# Patient Record
Sex: Female | Born: 1995 | Race: Black or African American | Hispanic: No | Marital: Single | Smoking: Former smoker
Health system: Southern US, Community
[De-identification: ages and names within clinical notes are randomized; demographics above are authoritative.]

## PROBLEM LIST (undated history)

## (undated) ENCOUNTER — Inpatient Hospital Stay (HOSPITAL_COMMUNITY): Payer: Self-pay

## (undated) DIAGNOSIS — O039 Complete or unspecified spontaneous abortion without complication: Secondary | ICD-10-CM

## (undated) DIAGNOSIS — E049 Nontoxic goiter, unspecified: Secondary | ICD-10-CM

## (undated) DIAGNOSIS — O09299 Supervision of pregnancy with other poor reproductive or obstetric history, unspecified trimester: Secondary | ICD-10-CM

## (undated) DIAGNOSIS — IMO0001 Reserved for inherently not codable concepts without codable children: Secondary | ICD-10-CM

## (undated) HISTORY — DX: Supervision of pregnancy with other poor reproductive or obstetric history, unspecified trimester: O09.299

---

## 2014-08-27 ENCOUNTER — Emergency Department (HOSPITAL_COMMUNITY)
Admission: EM | Admit: 2014-08-27 | Discharge: 2014-08-27 | Disposition: A | Discharge status: Home / Self Care | Payer: 59 | Attending: Physician Assistant | Admitting: Physician Assistant

## 2014-08-27 ENCOUNTER — Encounter (HOSPITAL_COMMUNITY): Payer: Self-pay | Admitting: *Deleted

## 2014-08-27 ENCOUNTER — Emergency Department (HOSPITAL_COMMUNITY): Payer: 59

## 2014-08-27 DIAGNOSIS — R0602 Shortness of breath: Secondary | ICD-10-CM | POA: Insufficient documentation

## 2014-08-27 DIAGNOSIS — R0789 Other chest pain: Secondary | ICD-10-CM | POA: Diagnosis present

## 2014-08-27 DIAGNOSIS — Z880 Allergy status to penicillin: Secondary | ICD-10-CM | POA: Diagnosis not present

## 2014-08-27 DIAGNOSIS — Z87891 Personal history of nicotine dependence: Secondary | ICD-10-CM | POA: Diagnosis not present

## 2014-08-27 LAB — BASIC METABOLIC PANEL
Anion gap: 8 (ref 5–15)
BUN: 6 mg/dL (ref 6–20)
CO2: 25 mmol/L (ref 22–32)
Calcium: 9.2 mg/dL (ref 8.9–10.3)
Chloride: 101 mmol/L (ref 101–111)
Creatinine, Ser: 0.67 mg/dL (ref 0.44–1.00)
GFR calc Af Amer: >60 mL/min (ref >60)
GFR calc non Af Amer: >60 mL/min (ref >60)
GLUCOSE: 87 mg/dL (ref 65–99)
POTASSIUM: 3.4 mmol/L — AB (ref 3.5–5.1)
Sodium: 134 mmol/L — ABNORMAL LOW (ref 135–145)

## 2014-08-27 LAB — CBC
HEMATOCRIT: 36 % (ref 36.0–46.0)
HEMOGLOBIN: 11.9 g/dL — AB (ref 12.0–15.0)
MCH: 26.2 pg (ref 26.0–34.0)
MCHC: 33.1 g/dL (ref 30.0–36.0)
MCV: 79.3 fL (ref 78.0–100.0)
Platelets: 230 10*3/uL (ref 150–400)
RBC: 4.54 MIL/uL (ref 3.87–5.11)
RDW: 14.3 % (ref 11.5–15.5)
WBC: 8.1 10*3/uL (ref 4.0–10.5)

## 2014-08-27 LAB — POC URINE PREG, ED: PREG TEST UR: NEGATIVE

## 2014-08-27 LAB — I-STAT TROPONIN, ED: Troponin i, poc: 0 ng/mL (ref 0.00–0.08)

## 2014-08-27 LAB — D-DIMER, QUANTITATIVE (NOT AT ARMC): D-Dimer, Quant: 0.8 ug/mL-FEU — ABNORMAL HIGH (ref 0.00–0.48)

## 2014-08-27 MED ORDER — GI COCKTAIL ~~LOC~~
30.0000 mL | Freq: Once | ORAL | Status: AC
  Administered 2014-08-27: 30 mL via ORAL
  Filled 2014-08-27: qty 30

## 2014-08-27 MED ORDER — IOHEXOL 350 MG/ML SOLN
75.0000 mL | Freq: Once | INTRAVENOUS | Status: AC | PRN
Start: 2014-08-27 — End: 2014-08-27
  Administered 2014-08-27: 75 mL via INTRAVENOUS

## 2014-08-27 MED ORDER — IBUPROFEN 800 MG PO TABS
800.0000 mg | ORAL_TABLET | Freq: Once | ORAL | Status: AC
  Administered 2014-08-27: 800 mg via ORAL
  Filled 2014-08-27: qty 1

## 2014-08-27 NOTE — ED Notes (Signed)
Patient reports centered chest pain when she takes a deep breath in. Rates pain 2/10.

## 2014-08-27 NOTE — ED Notes (Signed)
EDP at bedside  

## 2014-08-27 NOTE — ED Provider Notes (Signed)
CSN: [id number]     Arrival date & time 08/27/14  1251 History   First MD Initiated Contact with Patient 08/27/14 1332     Chief Complaint  Patient presents with  . Chest Pain     (Consider location/radiation/quality/duration/timing/severity/associated sxs/prior Treatment) HPI  Samantha Blair is a 19yo F with no PMH who presents with intermittent sharp substernal chest pain that began last night. She was sleeping and was woken up in the middle of the night by the substernal chest pain, which she describes as a sharp pain along with squeezing, occasionally radiating straight through to her back. She says that she also had some shortness of breath and palpitations at that time, but no other symptoms. She was awoken by another episode during the night and then had these symptoms throughout this morning. She denies any recent stressors, travel, sick contacts, substance use. Her pain is not associated with any meals. She is currently on her menstrual cycle. She does not take OCPs. She has no family history of clotting disorders or heart disease.  History reviewed. No pertinent past medical history. Past Surgical History  Procedure Laterality Date  . Cesarean section     No family history on file. History  Substance Use Topics  . Smoking status: Former Games developer  . Smokeless tobacco: Not on file  . Alcohol Use: No   OB History    No data available     Review of Systems  Constitutional: Negative for fever, chills and diaphoresis.  HENT: Negative for ear pain, sinus pressure and sore throat.   Eyes: Negative for visual disturbance.  Respiratory: Positive for shortness of breath. Negative for cough and wheezing.   Cardiovascular: Positive for chest pain and palpitations. Negative for leg swelling.  Gastrointestinal: Negative for nausea, vomiting, abdominal pain, diarrhea, constipation and blood in stool.  Endocrine: Negative for polyuria.  Genitourinary: Negative for dysuria, urgency, frequency,  flank pain, decreased urine volume and difficulty urinating.  Musculoskeletal: Negative for back pain and gait problem.  Skin: Negative for rash.  Neurological: Negative for dizziness, syncope, weakness, numbness and headaches.  Hematological: Does not bruise/bleed easily.  Psychiatric/Behavioral: Negative for confusion and agitation.  All other systems reviewed and are negative.     Allergies  Penicillins  Home Medications   Prior to Admission medications   Not on File   BP 123/83 mmHg  Pulse 96  Temp(Src) 100.6 F (38.1 C) (Oral)  Resp 17  Ht  (1.651 m)  Wt 145 lb (65.772 kg)  BMI 24.13 kg/m2  SpO2 100%  LMP 08/24/2014 Physical Exam  Constitutional: She is oriented to person, place, and time. She appears well-nourished. No distress.  HENT:  Head: Normocephalic and atraumatic.  Right Ear: External ear normal.  Left Ear: External ear normal.  Eyes: Conjunctivae are normal. Pupils are equal, round, and reactive to light.  Neck: Normal range of motion. Neck supple. No tracheal deviation present. No thyromegaly present.  Thyroid non-tender, non-enlarged, no palpable nodules  Cardiovascular: Normal heart sounds and intact distal pulses.  Exam reveals no gallop and no friction rub.   No murmur heard. Tachycardic rate, regular rhythm  Pulmonary/Chest: Effort normal and breath sounds normal. She has no wheezes. She has no rales.  Abdominal: Soft. Bowel sounds are normal. She exhibits no distension. There is no tenderness.  Musculoskeletal: Normal range of motion. She exhibits no edema or tenderness.  Neurological: She is alert and oriented to person, place, and time. She has normal reflexes. No cranial  nerve deficit. Coordination normal.  Skin: Skin is warm. No rash noted. She is not diaphoretic. No erythema.  Psychiatric: She has a normal mood and affect. Her behavior is normal.  Nursing note and vitals reviewed.   ED Course  Procedures (including critical care  time) Labs Review Labs Reviewed  BASIC METABOLIC PANEL - Abnormal; Notable for the following:    Sodium 134 (*)    Potassium 3.4 (*)    All other components within normal limits  CBC - Abnormal; Notable for the following:    Hemoglobin 11.9 (*)    All other components within normal limits  D-DIMER, QUANTITATIVE (NOT AT [facility])  Rosezena Sensor, ED  POC URINE PREG, ED    Imaging Review Dg Chest 2 View  08/27/2014   CLINICAL DATA:  Sternal chest pain which radiates to the back.  EXAM: CHEST  2 VIEW  COMPARISON:  None.  FINDINGS: Normal cardiac silhouette and mediastinal contours. There is mild leftward deviation of the tracheal air column at the level of the thoracic inlet. No focal airspace opacities. No pleural effusion or pneumothorax. There is minimal pleural parenchymal thickening about the bilateral major and the right minor fissures. No evidence of edema. No acute osseus abnormalities. Unchanged bones.  IMPRESSION: 1.  No acute cardiopulmonary disease. 2. Mild rightward deviation of the tracheal air column at the level of the thoracic inlet, nonspecific though asymmetric enlargement of the left lobe of the thyroid could result in a similar appearance. Clinical correlation is advised. Further evaluation with nonemergent thyroid ultrasound could be performed as clinically indicated.   Electronically Signed   By: Simonne Come M.D.   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 She is slightly tachycardic in the 100s-110s but her vitals and physical exam are otherwise unremarkable. EKG shows sinus tachycardia with QTc prolongation. Troponins negative, urine pregnancy negative, D-dimer positive. CV vs dyspepsia vs PE likely. Cardiology  consulted, say the patient needs to follow-up with an echo outpatient. Patient's chest pain relieved with Gi cocktail and patient is now not tachycardic. CT PE protocol ordered due to slightly elevated D-dimer. Otherwise now asymptomatic. Pending CTA results, will discharge home with follow-up with cardiology for follow-up of QTc.    Darrick Huntsman, MD 08/27/14 1620  Darrick Huntsman, MD 08/27/14 1621  Courteney Randall An, MD 08/27/14 [id number]

## 2014-08-27 NOTE — ED Notes (Signed)
Pt states sternal chest pain that radiates to her back.  She describes it as a pressure.  States when the pain becomes too great she becomes sob.  Denies nausea or diaphoresis.

## 2014-08-27 NOTE — Discharge Instructions (Signed)
We need you to follow up for two thing:  You will need an echo based on your EKG.  You need to have an ultrasound of your thyroid nodule.  Chest Pain (Nonspecific) It is often hard to give a diagnosis for the cause of chest pain. There is always a chance that your pain could be related to something serious, such as a heart attack or a blood clot in the lungs. You need to follow up with your doctor. HOME CARE  If antibiotic medicine was given, take it as directed by your doctor. Finish the medicine even if you start to feel better.  For the next few days, avoid activities that bring on chest pain. Continue physical activities as told by your doctor.  Do not use any tobacco products. This includes cigarettes, chewing tobacco, and e-cigarettes.  Avoid drinking alcohol.  Only take medicine as told by your doctor.  Follow your doctor's suggestions for more testing if your chest pain does not go away.  Keep all doctor visits you made. GET HELP IF:  Your chest pain does not go away, even after treatment.  You have a rash with blisters on your chest.  You have a fever. GET HELP RIGHT AWAY IF:   You have more pain or pain that spreads to your arm, neck, jaw, back, or belly (abdomen).  You have shortness of breath.  You cough more than usual or cough up blood.  You have very bad back or belly pain.  You feel sick to your stomach (nauseous) or throw up (vomit).  You have very bad weakness.  You pass out (faint).  You have chills. This is an emergency. Do not wait to see if the problems will go away. Call your local emergency services (911 in [place] Do not drive yourself to the hospital. MAKE SURE YOU:   Understand these instructions.  Will watch your condition.  Will get help right away if you are not doing well or get worse. Document Released: 07/02/2007 Document Revised: 01/18/2013 Document Reviewed: 07/02/2007 [facility] Patient Information 2015 [place], [place]. This  information is not intended to replace advice given to you by your health care provider. Make sure you discuss any questions you have with your health care provider.

## 2014-09-12 ENCOUNTER — Ambulatory Visit: Payer: 59 | Admitting: Physician Assistant

## 2014-09-12 ENCOUNTER — Ambulatory Visit: Payer: Self-pay

## 2014-11-24 ENCOUNTER — Encounter (HOSPITAL_COMMUNITY): Payer: Self-pay | Admitting: Emergency Medicine

## 2014-11-24 DIAGNOSIS — Z87891 Personal history of nicotine dependence: Secondary | ICD-10-CM | POA: Diagnosis not present

## 2014-11-24 DIAGNOSIS — H109 Unspecified conjunctivitis: Secondary | ICD-10-CM | POA: Insufficient documentation

## 2014-11-24 DIAGNOSIS — Z880 Allergy status to penicillin: Secondary | ICD-10-CM | POA: Insufficient documentation

## 2014-11-24 DIAGNOSIS — H578 Other specified disorders of eye and adnexa: Secondary | ICD-10-CM | POA: Diagnosis present

## 2014-11-24 NOTE — ED Notes (Signed)
Pt. reports right eye redness / irritation with mild drainage onset today , denies injury / no blurred vision .

## 2014-11-25 ENCOUNTER — Emergency Department (HOSPITAL_COMMUNITY)
Admission: EM | Admit: 2014-11-25 | Discharge: 2014-11-25 | Disposition: A | Discharge status: Home / Self Care | Payer: 59 | Attending: Emergency Medicine | Admitting: Emergency Medicine

## 2014-11-25 DIAGNOSIS — H109 Unspecified conjunctivitis: Secondary | ICD-10-CM

## 2014-11-25 MED ORDER — TOBRAMYCIN 0.3 % OP SOLN
2.0000 [drp] | OPHTHALMIC | Status: DC
  Administered 2014-11-25: 2 [drp] via OPHTHALMIC
  Filled 2014-11-25: qty 5

## 2014-11-25 NOTE — Discharge Instructions (Signed)
Use 2 drops in the affected eye every 4 hours.Bacterial Conjunctivitis Bacterial conjunctivitis, commonly called pink eye, is an inflammation of the clear membrane that covers the white part of the eye (conjunctiva). The inflammation can also happen on the underside of the eyelids. The blood vessels in the conjunctiva become inflamed, causing the eye to become red or pink. Bacterial conjunctivitis may spread easily from one eye to another and from person to person (contagious).  CAUSES  Bacterial conjunctivitis is caused by bacteria. The bacteria may come from your own skin, your upper respiratory tract, or from someone else with bacterial conjunctivitis. SYMPTOMS  The normally white color of the eye or the underside of the eyelid is usually pink or red. The pink eye is usually associated with irritation, tearing, and some sensitivity to light. Bacterial conjunctivitis is often associated with a thick, yellowish discharge from the eye. The discharge may turn into a crust on the eyelids overnight, which causes your eyelids to stick together. If a discharge is present, there may also be some blurred vision in the affected eye. DIAGNOSIS  Bacterial conjunctivitis is diagnosed by your caregiver through an eye exam and the symptoms that you report. Your caregiver looks for changes in the surface tissues of your eyes, which may point to the specific type of conjunctivitis. A sample of any discharge may be collected on a cotton-tip swab if you have a severe case of conjunctivitis, if your cornea is affected, or if you keep getting repeat infections that do not respond to treatment. The sample will be sent to a lab to see if the inflammation is caused by a bacterial infection and to see if the infection will respond to antibiotic medicines. TREATMENT   Bacterial conjunctivitis is treated with antibiotics. Antibiotic eyedrops are most often used. However, antibiotic ointments are also available. Antibiotics pills  are sometimes used. Artificial tears or eye washes may ease discomfort. HOME CARE INSTRUCTIONS   To ease discomfort, apply a cool, clean washcloth to your eye for 10-20 minutes, 3-4 times a day.  Gently wipe away any drainage from your eye with a warm, wet washcloth or a cotton ball.  Wash your hands often with soap and water. Use paper towels to dry your hands.  Do not share towels or washcloths. This may spread the infection.  Change or wash your pillowcase every day.  You should not use eye makeup until the infection is gone.  Do not operate machinery or drive if your vision is blurred.  Stop using contact lenses. Ask your caregiver how to sterilize or replace your contacts before using them again. This depends on the type of contact lenses that you use.  When applying medicine to the infected eye, do not touch the edge of your eyelid with the eyedrop bottle or ointment tube. SEEK IMMEDIATE MEDICAL CARE IF:   Your infection has not improved within 3 days after beginning treatment.  You had yellow discharge from your eye and it returns.  You have increased eye pain.  Your eye redness is spreading.  Your vision becomes blurred.  You have a fever or persistent symptoms for more than 2-3 days.  You have a fever and your symptoms suddenly get worse.  You have facial pain, redness, or swelling. MAKE SURE YOU:   Understand these instructions.  Will watch your condition.  Will get help right away if you are not doing well or get worse.   This information is not intended to replace advice given to  you by your health care provider. Make sure you discuss any questions you have with your health care provider.   Document Released: 01/13/2005 Document Revised: 02/03/2014 Document Reviewed: 06/16/2011 Elsevier Interactive Patient Education [employer]

## 2014-11-25 NOTE — ED Provider Notes (Signed)
CSN: [id number]     Arrival date & time 11/24/14  2323 History   First MD Initiated Contact with Patient 11/25/14 0015     Chief Complaint  Patient presents with  . Conjunctivitis     (Consider location/radiation/quality/duration/timing/severity/associated sxs/prior Treatment) HPI  Samantha Blair Is a 19 year old female who presents emergency Department with chief complaint of right eye redness and discharge. Onset today. Patient states she was sent home from work. She complains of some mild itching, lash mattering, denies visual acuity changes, eye pain, photophobia.  History reviewed. No pertinent past medical history. Past Surgical History  Procedure Laterality Date  . Cesarean section     No family history on file. Social History  Substance Use Topics  . Smoking status: Former Games developermoker  . Smokeless tobacco: None  . Alcohol Use: No   OB History    No data available     Review of Systems  Constitutional: Negative for fever.  Eyes: Positive for discharge, redness and itching. Negative for photophobia, pain and visual disturbance.      Allergies  Penicillins  Home Medications   Prior to Admission medications   Not on File   BP 122/69 mmHg  Pulse 75  Temp(Src) 98.8 F (37.1 C) (Oral)  Resp 16  SpO2 100%  LMP 11/06/2014 Physical Exam  Constitutional: She is oriented to person, place, and time. She appears well-developed and well-nourished. No distress.  HENT:  Head: Normocephalic and atraumatic.  Eyes: EOM are normal. Pupils are equal, round, and reactive to light. Right eye exhibits discharge. Right eye exhibits no chemosis and no exudate. Right conjunctiva is injected. No scleral icterus.  Neck: Normal range of motion.  Cardiovascular: Normal rate, regular rhythm and normal heart sounds.  Exam reveals no gallop and no friction rub.   No murmur heard. Pulmonary/Chest: Effort normal and breath sounds normal. No respiratory distress.  Abdominal: Soft. Bowel  sounds are normal. She exhibits no distension and no mass. There is no tenderness. There is no guarding.  Neurological: She is alert and oriented to person, place, and time.  Skin: Skin is warm and dry. She is not diaphoretic.    ED Course  Procedures (including critical care time) Labs Review Labs Reviewed - No data to display  Imaging Review No results found. I have personally reviewed and evaluated these images and lab results as part of my medical decision-making.   EKG Interpretation None      MDM   Final diagnoses:  Conjunctivitis of right eye    Patient with right eye conjunctivitis. She's Newmont MiningLash mattering, no eye pain, no visual disturbances. We'll discharge with tobramycin drops. Patient is safe for discharge at this time.    Arthor CaptainAbigail Nataliee Shurtz, PA-C 11/25/14 [id number]  Tomasita CrumbleAdeleke Oni, MD 11/25/14 [id number]

## 2015-01-28 NOTE — L&D Delivery Note (Signed)
Patient complete and pushing. SVD of viable female infant over intact perineum. Nuchal cord x 0. Infant delivered to mom's abdomen. Delayed cord clamping x 1 minute. Cord clamped x 2, cut. Spontaneous cry heard.   Cord blood obtained. Placenta delivered spontaneously and intact. LUS cleared of clot. Fundus firm on exam, pitocin running.  Lacerations: None Suture: EBL: 200 cc Anesthesia: epidural, local  Apgars: Pending documentation Weight: pending, skin to skin  Instrument and sponge count x2 correct.   Deniece ReeJosue D Santos, MD 12/08/2015 6:30 AM   I was present and agree with the above CRESENZO-DISHMAN,Azalee Weimer

## 2015-06-21 LAB — OB RESULTS CONSOLE RPR: RPR: NONREACTIVE

## 2015-06-21 LAB — OB RESULTS CONSOLE RUBELLA ANTIBODY, IGM: Rubella: IMMUNE

## 2015-06-21 LAB — OB RESULTS CONSOLE HEPATITIS B SURFACE ANTIGEN: HEP B S AG: NEGATIVE

## 2015-06-21 LAB — OB RESULTS CONSOLE ANTIBODY SCREEN: ANTIBODY SCREEN: NEGATIVE

## 2015-06-21 LAB — OB RESULTS CONSOLE ABO/RH: RH Type: POSITIVE

## 2015-06-21 LAB — OB RESULTS CONSOLE HIV ANTIBODY (ROUTINE TESTING): HIV: NONREACTIVE

## 2015-10-25 ENCOUNTER — Inpatient Hospital Stay (HOSPITAL_COMMUNITY)
Admission: AD | Admit: 2015-10-25 | Discharge: 2015-10-25 | Disposition: A | Discharge status: Home / Self Care | Payer: Medicaid Other | Source: Ambulatory Visit | Attending: Obstetrics and Gynecology | Admitting: Obstetrics and Gynecology

## 2015-10-25 ENCOUNTER — Encounter: Payer: Self-pay | Admitting: Student

## 2015-10-25 DIAGNOSIS — E049 Nontoxic goiter, unspecified: Secondary | ICD-10-CM

## 2015-10-25 DIAGNOSIS — Z880 Allergy status to penicillin: Secondary | ICD-10-CM | POA: Diagnosis not present

## 2015-10-25 DIAGNOSIS — Z7982 Long term (current) use of aspirin: Secondary | ICD-10-CM | POA: Insufficient documentation

## 2015-10-25 DIAGNOSIS — Z3A32 32 weeks gestation of pregnancy: Secondary | ICD-10-CM | POA: Insufficient documentation

## 2015-10-25 DIAGNOSIS — G510 Bell's palsy: Secondary | ICD-10-CM | POA: Insufficient documentation

## 2015-10-25 DIAGNOSIS — O99283 Endocrine, nutritional and metabolic diseases complicating pregnancy, third trimester: Secondary | ICD-10-CM | POA: Diagnosis not present

## 2015-10-25 DIAGNOSIS — O99353 Diseases of the nervous system complicating pregnancy, third trimester: Secondary | ICD-10-CM | POA: Diagnosis not present

## 2015-10-25 DIAGNOSIS — O26893 Other specified pregnancy related conditions, third trimester: Secondary | ICD-10-CM | POA: Diagnosis present

## 2015-10-25 DIAGNOSIS — Z87891 Personal history of nicotine dependence: Secondary | ICD-10-CM | POA: Diagnosis not present

## 2015-10-25 HISTORY — DX: Nontoxic goiter, unspecified: E04.9

## 2015-10-25 LAB — URINALYSIS, ROUTINE W REFLEX MICROSCOPIC
Bilirubin Urine: NEGATIVE
Glucose, UA: NEGATIVE mg/dL
Hgb urine dipstick: NEGATIVE
Ketones, ur: NEGATIVE mg/dL
Leukocytes, UA: NEGATIVE
NITRITE: NEGATIVE
PH: 7 (ref 5.0–8.0)
Protein, ur: NEGATIVE mg/dL
SPECIFIC GRAVITY, URINE: 1.02 (ref 1.005–1.030)

## 2015-10-25 LAB — T4, FREE: FREE T4: 0.64 ng/dL (ref 0.61–1.12)

## 2015-10-25 LAB — TSH: TSH: 0.156 u[IU]/mL — ABNORMAL LOW (ref 0.350–4.500)

## 2015-10-25 MED ORDER — PREDNISONE 10 MG (21) PO TBPK: ORAL_TABLET | ORAL | 0 refills | Status: DC

## 2015-10-25 NOTE — MAU Provider Note (Signed)
History     CSN: [id number]  Arrival date and time: 10/25/15 1153   First Provider Initiated Contact with Patient 10/25/15 1343      Chief Complaint  Patient presents with  . facial paralysis   HPI: Ms Samantha Blair is a G2P1 IUP 32 3/7 weeks who presents to MAU with 2 day H/O left facial numbness, unable to completely close her left eye and simile.  She denies any viral illness.   She reports + Fm. Denies any VB or LOF.  Prenatal care has been at [facility] and has been unremarkable except for enlarged thyroid, ? Goiter. No thyroid meds at present. H/O twins with preeclampsia in 2012.    Past Medical History:  Diagnosis Date  . Goiter    left side of neck    Past Surgical History:  Procedure Laterality Date  . CESAREAN SECTION      Family History  Problem Relation Age of Onset  . Cancer Maternal Grandmother   . Cancer Maternal Grandfather     Social History  Substance Use Topics  . Smoking status: Former Games developer  . Smokeless tobacco: Never Used  . Alcohol use No    Allergies:  Allergies  Allergen Reactions  . Penicillins Hives    Has patient had a PCN reaction causing immediate rash, facial/tongue/throat swelling, SOB or lightheadedness with hypotension: Yes Has patient had a PCN reaction causing severe rash involving mucus membranes or skin necrosis: Yes Has patient had a PCN reaction that required hospitalization Yes Has patient had a PCN reaction occurring within the last 10 years: No If all of the above answers are "NO", then may proceed with Cephalosporin use.     Prescriptions Prior to Admission  Medication Sig Dispense Refill Last Dose  . acetaminophen (TYLENOL) 500 MG tablet Take 500 mg by mouth every 6 (six) hours as needed.   Past Month at Unknown time  . aspirin EC 81 MG tablet Take 81 mg by mouth daily.   10/24/2015  . ferrous sulfate 325 (65 FE) MG tablet Take 325 mg by mouth 2 (two) times daily.  6 10/24/2015  . Prenatal Vit-Fe Fumarate-FA (PNV PRENATAL  PLUS MULTIVITAMIN) 27-1 MG TABS Take 1 tablet by mouth daily.  6 10/24/2015    Review of Systems  Constitutional: Negative for chills and fever.  Eyes: Negative.   Respiratory: Negative.   Cardiovascular: Negative.   Gastrointestinal: Negative.   Genitourinary: Negative.   Neurological: Positive for weakness. Negative for headaches.   Physical Exam   Blood pressure 127/83, pulse 96, temperature 98.5 F (36.9 C), temperature source Oral, resp. rate 16, height 5\' 6"  (1.676 m), weight 71.2 kg (157 lb), last menstrual period 11/06/2014.  Physical Exam  Constitutional: She is oriented to person, place, and time. She appears well-developed and well-nourished.  Eyes: EOM are normal.  Neck: Normal range of motion. Neck supple.  Cardiovascular: Normal rate and regular rhythm.   Respiratory: Effort normal and breath sounds normal.  GI: Soft. Bowel sounds are normal.  Neurological: She is alert and oriented to person, place, and time. She has normal reflexes.  CN 2-12 intact except for facial nerve on left side, decreased sensation and muscle weakness    MAU Course  Procedures Thyroid Labs.    Assessment and Plan  IUP 32 3/7 weeks Bell's Palsy Thyroid goiter H/O c section and Preeclampsia  Will check thyroid labs. Set up neck/thyroid U/S as out pt. Steroid taper, artificial tears and eye if desires for Bell's palsy.  Outpt follow up with neurology. Spoke with Dr Roxy Mannsster, neurology on call [facility] Start BASA due to h/o preeclampsia. [facility]  Aysen Shieh L Tiah Heckel 10/25/2015, 1:45 PM

## 2015-10-25 NOTE — Discharge Instructions (Signed)
Bell Palsy °Bell palsy is a condition in which the muscles on one side of the face become paralyzed. This often causes one side of the face to droop. It is a common condition and most people recover completely. °RISK FACTORS °Risk factors for Bell palsy include: °· Pregnancy. °· Diabetes. °· An infection by a virus, such as infections that cause cold sores. °CAUSES  °Bell palsy is caused by damage to or inflammation of a nerve in your face. It is unclear why this happens, but an infection by a virus may lead to it. Most of the time the reason it happens is unknown. °SIGNS AND SYMPTOMS  °Symptoms can range from mild to severe and can take place over a number of hours. Symptoms may include: °· Being unable to: °¨ Raise one or both eyebrows. °¨ Close one or both eyes. °¨ Feel parts of your face (facial numbness). °· Drooping of the eyelid and corner of the mouth. °· Weakness in the face. °· Paralysis of half your face. °· Loss of taste. °· Sensitivity to loud noises. °· Difficulty chewing. °· Tearing up of the affected eye. °· Dryness in the affected eye. °· Drooling. °· Pain behind one ear. °DIAGNOSIS  °Diagnosis of Bell palsy may include: °· A medical history and physical exam. °· An MRI. °· A CT scan. °· Electromyography (EMG). This is a test that checks how your nerves are working. °TREATMENT  °Treatment may include antiviral medicine to help shorten the length of the condition. Sometimes treatment is not needed and the symptoms go away on their own. °HOME CARE INSTRUCTIONS  °· Take medicines only as directed by your health care provider. °· Do facial massages and exercises as directed by your health care provider. °· If your eye is affected: °¨ Use moisturizing eye drops to prevent drying of your eye as directed by your health care provider. °¨ Protect your eye as directed by your health care provider. °SEEK MEDICAL CARE IF: °· Your symptoms do not get better or get worse. °· You are drooling. °· Your eye is red,  irritated, or hurts. °SEEK IMMEDIATE MEDICAL CARE IF:  °· Another part of your body feels weak or numb. °· You have difficulty swallowing. °· You have a fever along with symptoms of Bell palsy. °· You develop neck pain. °MAKE SURE YOU:  °· Understand these instructions. °· Will watch your condition. °· Will get help right away if you are not doing well or get worse. °  °This information is not intended to replace advice given to you by your health care provider. Make sure you discuss any questions you have with your health care provider. °  °Document Released: 01/13/2005 Document Revised: 10/04/2014 Document Reviewed: 04/22/2013 °Elsevier Interactive Patient Education ©2016 Else[employer] Inc[employer] ° °

## 2015-10-25 NOTE — MAU Note (Signed)
Pt states she cannot blink her L eye, also feels like the L side of her mouth is stiff, unable to smile.  Denies numbness or pain.  Sensation of taste is different.  Sx's started 2 days ago.  Denies abd pain, bleeding or LOF.

## 2015-10-26 LAB — T3, FREE: T3, Free: 3 pg/mL (ref 2.0–4.4)

## 2015-10-26 LAB — T3: T3 TOTAL: 174 ng/dL (ref 71–180)

## 2015-10-26 LAB — T3 UPTAKE: T3 Uptake Ratio: 17 % — ABNORMAL LOW (ref 24–39)

## 2015-10-26 LAB — T4: T4, Total: 8.5 ug/dL (ref 4.5–12.0)

## 2015-11-23 LAB — OB RESULTS CONSOLE GC/CHLAMYDIA
CHLAMYDIA, DNA PROBE: NEGATIVE
GC PROBE AMP, GENITAL: NEGATIVE

## 2015-11-23 LAB — OB RESULTS CONSOLE GBS: STREP GROUP B AG: NEGATIVE

## 2015-12-08 ENCOUNTER — Inpatient Hospital Stay (HOSPITAL_COMMUNITY)
Admission: AD | Admit: 2015-12-08 | Discharge: 2015-12-09 | DRG: 775 | Disposition: A | Discharge status: Home / Self Care | Payer: Medicaid Other | Source: Ambulatory Visit | Attending: Obstetrics & Gynecology | Admitting: Obstetrics & Gynecology

## 2015-12-08 ENCOUNTER — Encounter (HOSPITAL_COMMUNITY): Payer: Self-pay

## 2015-12-08 DIAGNOSIS — Z87891 Personal history of nicotine dependence: Secondary | ICD-10-CM

## 2015-12-08 DIAGNOSIS — E049 Nontoxic goiter, unspecified: Secondary | ICD-10-CM | POA: Diagnosis not present

## 2015-12-08 DIAGNOSIS — Z880 Allergy status to penicillin: Secondary | ICD-10-CM

## 2015-12-08 DIAGNOSIS — O99284 Endocrine, nutritional and metabolic diseases complicating childbirth: Secondary | ICD-10-CM | POA: Diagnosis present

## 2015-12-08 DIAGNOSIS — O34211 Maternal care for low transverse scar from previous cesarean delivery: Principal | ICD-10-CM | POA: Diagnosis present

## 2015-12-08 DIAGNOSIS — Z3403 Encounter for supervision of normal first pregnancy, third trimester: Secondary | ICD-10-CM | POA: Diagnosis present

## 2015-12-08 DIAGNOSIS — Z3A38 38 weeks gestation of pregnancy: Secondary | ICD-10-CM | POA: Diagnosis not present

## 2015-12-08 DIAGNOSIS — O34219 Maternal care for unspecified type scar from previous cesarean delivery: Secondary | ICD-10-CM

## 2015-12-08 DIAGNOSIS — O99824 Streptococcus B carrier state complicating childbirth: Secondary | ICD-10-CM | POA: Diagnosis present

## 2015-12-08 LAB — RPR: RPR: NONREACTIVE

## 2015-12-08 LAB — CBC
HCT: 36.4 % (ref 36.0–46.0)
Hemoglobin: 12.4 g/dL (ref 12.0–15.0)
MCH: 28 pg (ref 26.0–34.0)
MCHC: 34.1 g/dL (ref 30.0–36.0)
MCV: 82.2 fL (ref 78.0–100.0)
PLATELETS: 170 10*3/uL (ref 150–400)
RBC: 4.43 MIL/uL (ref 3.87–5.11)
RDW: 19.8 % — AB (ref 11.5–15.5)
WBC: 8.1 10*3/uL (ref 4.0–10.5)

## 2015-12-08 LAB — TYPE AND SCREEN
ABO/RH(D): O POS
Antibody Screen: NEGATIVE

## 2015-12-08 LAB — ABO/RH: ABO/RH(D): O POS

## 2015-12-08 MED ORDER — LACTATED RINGERS IV SOLN: 500.0000 mL | INTRAVENOUS | Status: DC | PRN

## 2015-12-08 MED ORDER — SOD CITRATE-CITRIC ACID 500-334 MG/5ML PO SOLN: 30.0000 mL | ORAL | Status: DC | PRN

## 2015-12-08 MED ORDER — PHENYLEPHRINE 40 MCG/ML (10ML) SYRINGE FOR IV PUSH (FOR BLOOD PRESSURE SUPPORT)
80.0000 ug | PREFILLED_SYRINGE | INTRAVENOUS | Status: DC | PRN
  Filled 2015-12-08: qty 5

## 2015-12-08 MED ORDER — DIBUCAINE 1 % RE OINT: 1.0000 "application " | TOPICAL_OINTMENT | RECTAL | Status: DC | PRN

## 2015-12-08 MED ORDER — DIPHENHYDRAMINE HCL 50 MG/ML IJ SOLN: 12.5000 mg | INTRAMUSCULAR | Status: DC | PRN

## 2015-12-08 MED ORDER — ONDANSETRON HCL 4 MG/2ML IJ SOLN: 4.0000 mg | INTRAMUSCULAR | Status: DC | PRN

## 2015-12-08 MED ORDER — TETANUS-DIPHTH-ACELL PERTUSSIS 5-2.5-18.5 LF-MCG/0.5 IM SUSP: 0.5000 mL | Freq: Once | INTRAMUSCULAR | Status: DC

## 2015-12-08 MED ORDER — SENNOSIDES-DOCUSATE SODIUM 8.6-50 MG PO TABS
2.0000 | ORAL_TABLET | ORAL | Status: DC
  Administered 2015-12-09: 2 via ORAL
  Filled 2015-12-08: qty 2

## 2015-12-08 MED ORDER — CLINDAMYCIN PHOSPHATE 900 MG/50ML IV SOLN
900.0000 mg | Freq: Three times a day (TID) | INTRAVENOUS | Status: DC
  Administered 2015-12-08: 900 mg via INTRAVENOUS
  Filled 2015-12-08 (×2): qty 50

## 2015-12-08 MED ORDER — OXYCODONE-ACETAMINOPHEN 5-325 MG PO TABS
1.0000 | ORAL_TABLET | ORAL | Status: DC | PRN
  Administered 2015-12-08: 1 via ORAL
  Filled 2015-12-08 (×2): qty 1

## 2015-12-08 MED ORDER — LACTATED RINGERS IV SOLN
INTRAVENOUS | Status: DC
  Administered 2015-12-08: 04:00:00 via INTRAVENOUS

## 2015-12-08 MED ORDER — FLEET ENEMA 7-19 GM/118ML RE ENEM: 1.0000 | ENEMA | RECTAL | Status: DC | PRN

## 2015-12-08 MED ORDER — LIDOCAINE HCL (PF) 1 % IJ SOLN
30.0000 mL | INTRAMUSCULAR | Status: DC | PRN
  Filled 2015-12-08: qty 30

## 2015-12-08 MED ORDER — OXYTOCIN BOLUS FROM INFUSION
500.0000 mL | Freq: Once | INTRAVENOUS | Status: AC
  Administered 2015-12-08: 500 mL via INTRAVENOUS

## 2015-12-08 MED ORDER — ACETAMINOPHEN 325 MG PO TABS: 650.0000 mg | ORAL_TABLET | ORAL | Status: DC | PRN

## 2015-12-08 MED ORDER — FENTANYL CITRATE (PF) 100 MCG/2ML IJ SOLN
100.0000 ug | INTRAMUSCULAR | Status: DC | PRN
  Administered 2015-12-08: 100 ug via INTRAVENOUS
  Filled 2015-12-08: qty 2

## 2015-12-08 MED ORDER — OXYCODONE-ACETAMINOPHEN 5-325 MG PO TABS: 2.0000 | ORAL_TABLET | ORAL | Status: DC | PRN

## 2015-12-08 MED ORDER — PRENATAL MULTIVITAMIN CH
1.0000 | ORAL_TABLET | Freq: Every day | ORAL | Status: DC
  Administered 2015-12-08 – 2015-12-09 (×2): 1 via ORAL
  Filled 2015-12-08 (×2): qty 1

## 2015-12-08 MED ORDER — LIDOCAINE HCL (PF) 1 % IJ SOLN
INTRAMUSCULAR | Status: AC
  Filled 2015-12-08: qty 30

## 2015-12-08 MED ORDER — BENZOCAINE-MENTHOL 20-0.5 % EX AERO: 1.0000 "application " | INHALATION_SPRAY | CUTANEOUS | Status: DC | PRN

## 2015-12-08 MED ORDER — ONDANSETRON HCL 4 MG/2ML IJ SOLN: 4.0000 mg | Freq: Four times a day (QID) | INTRAMUSCULAR | Status: DC | PRN

## 2015-12-08 MED ORDER — OXYTOCIN 40 UNITS IN LACTATED RINGERS INFUSION - SIMPLE MED: 2.5000 [IU]/h | INTRAVENOUS | Status: DC

## 2015-12-08 MED ORDER — EPHEDRINE 5 MG/ML INJ
10.0000 mg | INTRAVENOUS | Status: DC | PRN
  Filled 2015-12-08: qty 4

## 2015-12-08 MED ORDER — ONDANSETRON HCL 4 MG PO TABS: 4.0000 mg | ORAL_TABLET | ORAL | Status: DC | PRN

## 2015-12-08 MED ORDER — COCONUT OIL OIL: 1.0000 "application " | TOPICAL_OIL | Status: DC | PRN

## 2015-12-08 MED ORDER — IBUPROFEN 600 MG PO TABS
600.0000 mg | ORAL_TABLET | Freq: Four times a day (QID) | ORAL | Status: DC
  Administered 2015-12-08 – 2015-12-09 (×5): 600 mg via ORAL
  Filled 2015-12-08 (×5): qty 1

## 2015-12-08 MED ORDER — WITCH HAZEL-GLYCERIN EX PADS: 1.0000 "application " | MEDICATED_PAD | CUTANEOUS | Status: DC | PRN

## 2015-12-08 MED ORDER — ZOLPIDEM TARTRATE 5 MG PO TABS: 5.0000 mg | ORAL_TABLET | Freq: Every evening | ORAL | Status: DC | PRN

## 2015-12-08 MED ORDER — OXYTOCIN 40 UNITS IN LACTATED RINGERS INFUSION - SIMPLE MED
INTRAVENOUS | Status: AC
  Filled 2015-12-08: qty 1000

## 2015-12-08 MED ORDER — SIMETHICONE 80 MG PO CHEW: 80.0000 mg | CHEWABLE_TABLET | ORAL | Status: DC | PRN

## 2015-12-08 MED ORDER — FENTANYL 2.5 MCG/ML BUPIVACAINE 1/10 % EPIDURAL INFUSION (WH - ANES)
14.0000 mL/h | INTRAMUSCULAR | Status: DC | PRN
Start: 2015-12-08 — End: 2015-12-08

## 2015-12-08 MED ORDER — DIPHENHYDRAMINE HCL 25 MG PO CAPS: 25.0000 mg | ORAL_CAPSULE | Freq: Four times a day (QID) | ORAL | Status: DC | PRN

## 2015-12-08 MED ORDER — LACTATED RINGERS IV SOLN: 500.0000 mL | Freq: Once | INTRAVENOUS | Status: DC

## 2015-12-08 NOTE — Progress Notes (Signed)
Notified of pt arrival in MAU and exam with advanced dilation. Will admit to labor and delivery.

## 2015-12-08 NOTE — H&P (Signed)
LABOR AND DELIVERY ADMISSION HISTORY AND PHYSICAL NOTE  Samantha Blair is a 20 y.o. female 682P0102 with IUP at 2070w5d by US presenting for SOL.   She reports positive fetal movement. She denies leakage of fluid or vaginal bleeding.  Prenatal History/Complications:  Past Medical History: Past Medical History:  Diagnosis Date  . Goiter    left side of neck    Past Surgical History: Past Surgical History:  Procedure Laterality Date  . CESAREAN SECTION      Obstetrical History: OB History    Gravida Para Term Preterm AB Living   2 1 0 1 0 2   SAB TAB Ectopic Multiple Live Births         1 2      Social History: Social History   Social History  . Marital status: Single    Spouse name: N/A  . Number of children: N/A  . Years of education: N/A   Social History Main Topics  . Smoking status: Former Games developermoker  . Smokeless tobacco: Never Used  . Alcohol use No  . Drug use: No  . Sexual activity: Yes    Birth control/ protection: None   Other Topics Concern  . Not on file   Social History Narrative  . No narrative on file    Family History: Family History  Problem Relation Age of Onset  . Cancer Maternal Grandmother   . Cancer Maternal Grandfather     Allergies: Allergies  Allergen Reactions  . Penicillins Hives    Has patient had a PCN reaction causing immediate rash, facial/tongue/throat swelling, SOB or lightheadedness with hypotension: Yes Has patient had a PCN reaction causing severe rash involving mucus membranes or skin necrosis: Yes Has patient had a PCN reaction that required hospitalization Yes Has patient had a PCN reaction occurring within the last 10 years: No If all of the above answers are "NO", then may proceed with Cephalosporin use.     Prescriptions Prior to Admission  Medication Sig Dispense Refill Last Dose  . acetaminophen (TYLENOL) 500 MG tablet Take 500 mg by mouth every 6 (six) hours as needed.   Past Month at Unknown time  .  aspirin EC 81 MG tablet Take 81 mg by mouth daily.   10/24/2015  . ferrous sulfate 325 (65 FE) MG tablet Take 325 mg by mouth 2 (two) times daily.  6 10/24/2015  . predniSONE (STERAPRED UNI-PAK 21 TAB) 10 MG (21) TBPK tablet Take 6 tablets qd x 5 days, then 5 tablets qd x 1 day, then 4 tablets qd x 1 day, then 3 tablets qd x 1 day, then 2 tablets qd x1 and then 1 tablet x 1 day. 21 tablet 0   . Prenatal Vit-Fe Fumarate-FA (PNV PRENATAL PLUS MULTIVITAMIN) 27-1 MG TABS Take 1 tablet by mouth daily.  6 10/24/2015     Review of Systems   All systems reviewed and negative except as stated in HPI  Blood pressure 136/89, pulse 80, temperature 97.9 F (36.6 C), temperature source Oral, resp. rate 18, height 5\' 6"  (1.676 m), weight 166 lb 8 oz (75.5 kg), last menstrual period 11/06/2014, SpO2 100 %. General appearance: alert, cooperative, appears stated age and moderate distress Heart: regular rate and rhythm with intact distal pulses Abdomen: soft, non-tender Extremities: No calf swelling or tenderness Fetal monitoring: Pending Uterine activity: Q1-3 minutes Dilation: 8   Prenatal labs: ABO, Rh: O/Positive/-- (05/25 0000) Antibody: Negative (05/25 0000) Rubella: !Error! RPR: Nonreactive (05/25 0000)  HBsAg:  Negative (05/25 0000)  HIV: Non-reactive (05/25 0000)  GBS: Negative (10/27 0000)  1 hr Glucola: 99 Genetic screening:  Negative Anatomy US: Normal  Prenatal Transfer Tool  Maternal Diabetes: No Genetic Screening: Normal Maternal Ultrasounds/Referrals: Normal Fetal Ultrasounds or other Referrals:  None Maternal Substance Abuse:  No Significant Maternal Medications:  None Significant Maternal Lab Results: None  No results found for this or any previous visit (from the past 24 hour(s)).  Patient Active Problem List   Diagnosis Date Noted  . Normal labor 12/08/2015  . Thyroid enlarged 10/25/2015    Assessment: Samantha Blair is a 20 y.o. G2P0102 at 4389w5d here for SOL [facility]  [facility]  #Labor:Patient admitted for SOL. Will monitor for natural progression of labor #Pain: IV Fentanyl #FWB: Pending monitor placement #ID:  GBS positive with PCN allergy, ordered 900mg  IV Clinda #MOF: Breast #MOC:Depo  Josue D Santos 12/08/2015, 4:04 AM   I agree with the above CRESENZO-DISHMAN,Marlyn Rabine

## 2015-12-08 NOTE — Lactation Note (Signed)
This note was copied from a baby's chart. Lactation Consultation Note  Patient Name: Samantha Blair ZOXWR'UToday's Date: 12/08/2015 Reason for consult: Initial assessment Baby is 10 hours old , baby has been to the breast x4 and 7-25 mins , few 5 min feedings. Latch score 8-10 -8.  Per mom  Has been shown by RN how to hand express and was able to get drops. Presently baby asleep STS in mom's arms. LC encouraged mom to call with feeding cues for [facility] the RN should assist.  Per mom with her premie twins only pumped for 3 days and did not continue.  Mother informed of post-discharge support and given phone number to the lactation department, including services for phone call assistance; out-patient appointments; and breastfeeding support group. List of other breastfeeding resources in the community given in the handout. Encouraged mother to call for problems or concerns related to breastfeeding.  Maternal Data Does the patient have breastfeeding experience prior to this delivery?: Yes  Feeding Feeding Type:  (per mom recently attempted ) Length of feed: 3 min (per mom few sucks )  LATCH Score/Interventions Latch: Too sleepy or reluctant, no latch achieved, no sucking elicited.  Audible Swallowing: A few with stimulation Intervention(s): Skin to skin;Hand expression Intervention(s): Alternate breast massage;Skin to skin;Hand expression  Type of Nipple: Everted at rest and after stimulation  Comfort (Breast/Nipple): Soft / non-tender     Hold (Positioning): Assistance needed to correctly position infant at breast and maintain latch. Intervention(s): Breastfeeding basics reviewed  LATCH Score: 8  Lactation Tools Discussed/Used WIC Program: No   Consult Status Consult Status: Follow-up Date: 12/08/15 Follow-up type: In-patient    Samantha Blair 12/08/2015, 4:42 PM

## 2015-12-08 NOTE — MAU Note (Addendum)
G2P1L2 Previous primary cesarean section emergent. Pt states due to Hight Bp. Denies LOF or bleeding. Contractions past hr and progressively getting stronger. VSS see flow sheet for details  0338: SVE 8 BB

## 2015-12-09 ENCOUNTER — Ambulatory Visit: Payer: Self-pay

## 2015-12-09 DIAGNOSIS — O34219 Maternal care for unspecified type scar from previous cesarean delivery: Secondary | ICD-10-CM

## 2015-12-09 MED ORDER — IBUPROFEN 600 MG PO TABS: 600.0000 mg | ORAL_TABLET | Freq: Four times a day (QID) | ORAL | 0 refills | Status: DC

## 2015-12-09 MED ORDER — DOCUSATE SODIUM 100 MG PO CAPS: 100.0000 mg | ORAL_CAPSULE | Freq: Two times a day (BID) | ORAL | 0 refills | Status: DC

## 2015-12-09 NOTE — Discharge Summary (Signed)
OB Discharge Summary     Patient Name: Samantha Blair DOB: March 16, 1995 MRN: [id number]  Date of admission: 12/08/2015 Delivering MD: Samantha Blair   Date of discharge: 12/09/2015  Admitting diagnosis: Ctx Intrauterine pregnancy: 2955w5d     Secondary diagnosis:  Active Problems:   Normal labor   VBAC (vaginal birth after Cesarean)  Additional problems: None     Discharge diagnosis: Term Pregnancy Delivered and VBAC                                                                                                Post partum procedures:None  Augmentation: None  Complications: None  Hospital course:  Onset of Labor With Vaginal Delivery     20 y.o. yo [id number] at 8655w5d was admitted in Active Labor on 12/08/2015. Patient had an uncomplicated labor course as follows:  Membrane Rupture Time/Date: 5:09 AM ,12/08/2015   Intrapartum Procedures: Episiotomy: None [1]                                         Lacerations:  None [1]  Patient had a delivery of a Viable infant via successful VBAC. 12/08/2015  Information for the patient's newborn:  Samantha Blair [[id number]][[id number]]  Delivery Method: Vag-Spont    Pateint had an uncomplicated postpartum course.  She is ambulating, tolerating a regular diet, passing flatus, and urinating well. Patient is discharged home in stable condition on 12/09/15.    Physical exam Vitals:   12/08/15 1332 12/08/15 1722 12/08/15 2011 12/09/15 0532  BP: 133/75 124/78 128/75 110/71  Pulse: 70 69 68 62  Resp: 18 16 20 18   Temp: 98.2 F (36.8 C) 98.5 F (36.9 C) 98.9 F (37.2 C) 98 F (36.7 C)  TempSrc: Oral Oral Oral Oral  SpO2:  100% 100%   Weight:      Height:       General: alert, cooperative and no distress Lochia: appropriate Uterine Fundus: firm Incision: N/A DVT Evaluation: No evidence of DVT seen on physical exam. Negative Homan's sign. No cords or calf tenderness. Labs: Lab Results  Component Value Date   WBC 8.1 12/08/2015   HGB  12.4 12/08/2015   HCT 36.4 12/08/2015   MCV 82.2 12/08/2015   PLT 170 12/08/2015   CMP Latest Ref Rng & Units 08/27/2014  Glucose 65 - 99 mg/dL 87  BUN 6 - 20 mg/dL 6  Creatinine 1.300.44 - 8.651.00 mg/dL 7.840.67  Sodium 696135 - 295145 mmol/L 134(L)  Potassium 3.5 - 5.1 mmol/L 3.4(L)  Chloride 101 - 111 mmol/L 101  CO2 22 - 32 mmol/L 25  Calcium 8.9 - 10.3 mg/dL 9.2    Discharge instruction: per After Visit Summary and "Baby and Me Booklet".  After visit meds:    Medication List    TAKE these medications   acetaminophen 500 MG tablet Commonly known as:  TYLENOL Take 500 mg by mouth every 6 (six) hours as needed.   docusate sodium 100 MG capsule Commonly known as:  COLACE Take 1 capsule (100 mg total) by mouth 2 (two) times daily.   ibuprofen 600 MG tablet Commonly known as:  ADVIL,MOTRIN Take 1 tablet (600 mg total) by mouth every 6 (six) hours.   PNV PRENATAL PLUS MULTIVITAMIN 27-1 MG Tabs Take 1 tablet by mouth daily.       Diet: routine diet  Activity: Advance as tolerated. Pelvic rest for 6 weeks.   Outpatient follow up:6 weeks Follow up Appt:No future appointments. Follow up Visit:No Follow-up on file.  Postpartum contraception: Depo Provera  Newborn Data: Live born female  Birth Weight: 7 lb 6.9 oz (3371 g) APGAR: 9, 9  Baby Feeding: Breast Disposition:home with mother   12/09/2015 Samantha MowElizabeth Mumaw, DO OB Fellow

## 2015-12-09 NOTE — Lactation Note (Signed)
This note was copied from a baby's chart. Lactation Consultation Note  Patient Name: Samantha Blair FAOZH'YToday's Date: 12/09/2015 Reason for consult: Follow-up assessment;Hyperbilirubinemia   Follow up assessment with mom of 31 hour old infant. Infant BF x 6 for 10-30 minutes, 7 attempts, 5 stools and 1 void in 24 hours preceding this assessment. Infant weight 7 lb 3.3 oz with 3% weight loss since birth. Infant has been started on double phototherapy this morning.   Assisted mom in latching infant to breast on photo therapy. She initially was sleepy and did not show an interest in feeding. Hand expressed mom's left breast and obtained 1 cc colostrum very easily. Spoon fed to infant. Infant then latched to right breast in the cross cradle hold and fed for over 25 minutes. Infant was sleepy on and off, reviewed awakening techniques with mom and enc her to awaken infant throughout feeding as needed to maintain active suckling. Mom voiced understanding. Infant was still feeding when I left the room. Discussed importance of keeping phototherapy on infant throughout feeding and all times.   Set up DEBP for mom to begin pumping due to phototherapy and decreased urine output in infant. Showed mom how to use pump in Initiate setting. Showed her assembly, disassembly and cleaning of pump parts. Mom voiced understanding. Mom voiced that she does want infant to have formula. Reviewed hand expression and colostrum collection containers are at bedside.   Plan made with mom and written on board in room, and shared with Shanda BumpsJessica, RN: Breastfeed infant 8-12 x in 24 hours at first feeding cues with no longer than 3 hours between feeds Supplement infant with any available EBM via spoon Pump for 15 minutes with DEBP every 2-3 hour post BF on Initiate setting Hand Express post pumping.  Call out for feeding assistance as needed Follow up tomorrow and prn.   Maternal Data Formula Feeding for Exclusion: No Has  patient been taught Hand Expression?: Yes Does the patient have breastfeeding experience prior to this delivery?: No  Feeding Feeding Type: Breast Fed Length of feed: 30 min  LATCH Score/Interventions Latch: Grasps breast easily, tongue down, lips flanged, rhythmical sucking. Intervention(s): Skin to skin;Teach feeding cues;Waking techniques Intervention(s): Adjust position;Assist with latch;Breast massage;Breast compression  Audible Swallowing: Spontaneous and intermittent Intervention(s): Hand expression;Alternate breast massage;Skin to skin  Type of Nipple: Everted at rest and after stimulation  Comfort (Breast/Nipple): Filling, red/small blisters or bruises, mild/mod discomfort  Problem noted: Mild/Moderate discomfort  Hold (Positioning): Assistance needed to correctly position infant at breast and maintain latch. Intervention(s): Breastfeeding basics reviewed;Support Pillows;Position options;Skin to skin  LATCH Score: 8  Lactation Tools Discussed/Used Pump Review: Setup, frequency, and cleaning;Milk Storage Initiated by:: Noralee StainSharon Cara Thaxton, RN, IBCLC Date initiated:: 12/09/15   Consult Status Consult Status: Follow-up Date: 12/10/15 Follow-up type: In-patient    Silas FloodSharon S Warren Lindahl 12/09/2015, 2:54 PM

## 2015-12-09 NOTE — Clinical Social Work Maternal (Signed)
CLINICAL SOCIAL WORK MATERNAL/CHILD NOTE  Patient Details  Name: Samantha Blair MRN: [id number] Date of Birth: 11/05/1995  Date:  12/09/2015  Clinical Social Worker Initiating Note:   , MSW, LCSW-A   Date/ Time Initiated:  12/09/15/1150              Child's Name:  Samantha Blair    Legal Guardian:  Other (Comment) (Not established by court system; MOB single parenting at the moment. )   Need for Interpreter:  None   Date of Referral:  12/08/15     Reason for Referral:  Other (Comment) (MOB hx of anxiety/depression )   Referral Source:  Physician   Address:  [address] [place], [place] [address]  Phone number:  [phone]   Household Members: Self   Natural Supports (not living in the home): Immediate Family, Parent   Professional Supports:None   Employment:    Type of Work:     Education:      Financial Resources:Medicaid   Other Resources:     Cultural/Religious Considerations Which May Impact Care: None reported at this time.   Strengths: Ability to meet basic needs , Compliance with medical plan , Home prepared for child    Risk Factors/Current Problems: None   Cognitive State: Alert , Able to Concentrate , Insightful , Goal Oriented    Mood/Affect: Comfortable , Calm , Relaxed    CSW Assessment:CSW met with MOB at bedside to complete assessment. At this time, MOB was resting in bed and baby was asleep in hospital rolling crib. This writer explained role and reasoning for visit being due to her hx of anxiety/depression. At this time, MOB noted she dealt with it a little during her pregnancy; however, was never formally dx. This writer inquired if MOB is interested in receiving resources for outside therapy. MOB noted no she is not as she feels fine now. This writer discussed PPD and SIDS. MOB verbalized understanding of both. At this time, no other needs were addressed or requested thus case  closed to this CSW.    CSW Plan/Description: No Further Intervention Required/No Barriers to Discharge     , MSW, LCSW-A Clinical Social Worker  [facility]  Office: [phone]    CLINICAL SOCIAL WORK MATERNAL/CHILD NOTE  Patient Details  Name: Samantha Blair MRN: [id number] Date of Birth: 1996/01/16  Date:  12/09/2015  Clinical Social Worker Initiating Note:  Ferdinand Lango , MSW, LCSW-A  Date/ Time Initiated:  12/09/15/1150     Child's Name:  Samantha Blair    Legal Guardian:  Other (Comment) (Not established by court system; MOB single parenting at the moment. )   Need for Interpreter:  None   Date of Referral:  12/08/15     Reason for Referral:  Other (Comment) (MOB hx of anxiety/depression )   Referral Source:  Physician   Address:  [place], [place] [address]  Phone number:  [phone]   Household Members:  Self   Natural Supports (not living in the home):  Immediate Family, Parent   Professional Supports: None   Employment:     Type of Work:     Education:      Museum/gallery curator Resources:  [employer]   Other Resources:      Cultural/Religious Considerations Which May Impact Care:  None reported at this time.   Strengths:  Ability to meet basic needs , Compliance with medical plan , Home prepared for child    Risk Factors/Current Problems:  None   Cognitive State:  Alert , Able to Concentrate , Insightful , Goal Oriented    Mood/Affect:  Comfortable , Calm , Relaxed    CSW Assessment: CSW met with MOB at bedside to complete assessment. At this time, MOB was resting in bed and baby was asleep in hospital rolling crib. This Probation officer explained role and reasoning for visit being due to her hx of anxiety/depression. At this time, MOB noted she dealt with it a little during her pregnancy; however, was never formally dx. This Probation officer inquired if MOB is interested in receiving resources for outside therapy. MOB noted no she is not as she feels fine now. This Probation officer discussed PPD and SIDS. MOB verbalized understanding of both. At this time, no other needs were addressed or requested thus case closed to this CSW.    CSW  Plan/Description:  No Further Intervention Required/No Barriers to Discharge    Oda Cogan, MSW, [facility]  Office: [phone]

## 2015-12-09 NOTE — Discharge Instructions (Signed)
Vaginal Delivery, Care After °Refer to this sheet in the next few weeks. These discharge instructions provide you with information on caring for yourself after delivery. Your health care provider may also give you specific instructions. Your treatment has been planned according to the most current medical practices available, but problems sometimes occur. Call your health care provider if you have any problems or questions after you go home. °HOME CARE INSTRUCTIONS °· Take over-the-counter or prescription medicines only as directed by your health care provider or pharmacist. °· Do not drink alcohol, especially if you are breastfeeding or taking medicine to relieve pain. °· Do not chew or smoke tobacco. °· Do not use illegal drugs. °· Continue to use good perineal care. Good perineal care includes: °¨ Wiping your perineum from front to back. °¨ Keeping your perineum clean. °· Do not use tampons or douche until your health care provider says it is okay. °· Shower, wash your hair, and take tub baths as directed by your health care provider. °· Wear a well-fitting bra that provides breast support. °· Eat healthy foods. °· Drink enough fluids to keep your urine clear or pale yellow. °· Eat high-fiber foods such as whole grain cereals and breads, brown rice, beans, and fresh fruits and vegetables every day. These foods may help prevent or relieve constipation. °· Follow your health care provider's recommendations regarding resumption of activities such as climbing stairs, driving, lifting, exercising, or traveling. °· Talk to your health care provider about resuming sexual activities. Resumption of sexual activities is dependent upon your risk of infection, your rate of healing, and your comfort and desire to resume sexual activity. °· Try to have someone help you with your household activities and your newborn for at least a few days after you leave the hospital. °· Rest as much as possible. Try to rest or take a nap  when your newborn is sleeping. °· Increase your activities gradually. °· Keep all of your scheduled postpartum appointments. It is very important to keep your scheduled follow-up appointments. At these appointments, your health care provider will be checking to make sure that you are healing physically and emotionally. °SEEK MEDICAL CARE IF:  °· You are passing large clots from your vagina. Save any clots to show your health care provider. °· You have a foul smelling discharge from your vagina. °· You have trouble urinating. °· You are urinating frequently. °· You have pain when you urinate. °· You have a change in your bowel movements. °· You have increasing redness, pain, or swelling near your vaginal incision (episiotomy) or vaginal tear. °· You have pus draining from your episiotomy or vaginal tear. °· Your episiotomy or vaginal tear is separating. °· You have painful, hard, or reddened breasts. °· You have a severe headache. °· You have blurred vision or see spots. °· You feel sad or depressed. °· You have thoughts of hurting yourself or your newborn. °· You have questions about your care, the care of your newborn, or medicines. °· You are dizzy or light-headed. °· You have a rash. °· You have nausea or vomiting. °· You were breastfeeding and have not had a menstrual period within 12 weeks after you stopped breastfeeding. °· You are not breastfeeding and have not had a menstrual period by the 12th week after delivery. °· You have a fever. °SEEK IMMEDIATE MEDICAL CARE IF:  °· You have persistent pain. °· You have chest pain. °· You have shortness of breath. °· You faint. °· You   have leg pain. °· You have stomach pain. °· Your vaginal bleeding saturates two or more sanitary pads in 1 hour. °  °This information is not intended to replace advice given to you by your health care provider. Make sure you discuss any questions you have with your health care provider. °  °Document Released: 01/11/2000 Document Revised:  10/04/2014 Document Reviewed: 09/10/2011 °Elsevier Interactive Patient Education ©2016 [employer] ° ° °Storing Breast Milk °Breast milk is a living fluid that contains infection-fighting cells (antibodies). Pre-pumped (expressed) breast milk needs to be stored in a certain way so that it remains effective in protecting your baby against infections. The following guidelines are for storing breast milk for a healthy, full-term infant.  °HOW LONG CAN BREAST MILK BE STORED? °· Milk can be stored for up to 4 hours at room temperature, or 60-85°F (15.6-19.4°C). However, it is acceptable to allow the milk to sit for 6-8 hours if the pump parts and containers are well cleaned. °· Milk can be stored for 3 days in a refrigerator at less than 39°F (3.9°C). However, it is acceptable to allow the milk to sit for up to 8 days if the pump parts and containers are well cleaned. °· Milk can be stored for 2 weeks in a freezer compartment inside a refrigerator. °· Milk can be stored for 3-4 months in a freezer unit with a separate door. °· Milk can be stored for 6-12 months in a deep freezer at -4°F (-20°C). A deep freezer is a chest or stand-alone freezer that is not opened very often and stays at a colder temperature. °HOW SHOULD I STORE BREAST MILK? °· Milk may be stored in a: °¨ Glass container. °¨ Hard plastic container. °¨ Plastic bag specially designed for storing milk. Many women like these because they take up less space and can be attached directly to the breast pump. °· Store your milk in 2-4 oz (60-120 mL) servings. This makes it easier to thaw the milk. It also helps you avoid having to throw out milk your baby does not drink. °· Leave an inch or so at the top of the bag or bottle so the milk has room to expand as it freezes. °· Label each container with the date and time the milk was pumped so that you use the milk in the order it was pumped. °· If you will be freezing the milk, store it in the back of the freezer.  This prevents the milk from being affected by temperature changes due to the freezer door being opened. °THAWING FROZEN BREAST MILK °· Frozen milk can be thawed: °¨ In a refrigerator. °¨ Under warm-running tap water. °¨ In a pan of warm water that has been heated on the stove. °· Do not heat milk directly on the stove or in a microwave as this will destroy some of its infection-fighting properties. °· Thawed milk can be stored in the refrigerator for up to 24 hours but should not be refrozen. °· If you want to add freshly pumped milk to the frozen milk, make sure to add less milk than what is already frozen. Chill the fresh milk in your refrigerator for 30 minutes before adding it to the milk in the freezer. °  °This information is not intended to replace advice given to you by your health care provider. Make sure you discuss any questions you have with your health care provider. °  °Document Released: 11/10/2008 Document Revised: 01/18/2013 Document Reviewed: 10/25/2012 °Elsevier Interactive   Education ©2016 Elsevier Inc. ° °

## 2015-12-10 ENCOUNTER — Ambulatory Visit: Payer: Self-pay

## 2015-12-10 NOTE — Lactation Note (Signed)
This note was copied from a baby's chart. Lactation Consultation Note Baby had only 1 void in 44 hrs. On DPT. Moms breast filling. Good everted nipples. Assisted positioning and latching baby. Mom not using props or support while holding baby and not getting good deep latch. Mom didn't have back support. Discussed comfort, positioning, props, body alignment, breast massage, cheeks to breast, swallows, assessing breast for milk transfer before and after BF. Post pumping and hand expressing to supplement after BF.  Importance of feeding, I&O, stimulating baby to BF d/t sleepy from hyperbilirubinemia.  RN had given mom coconut oil for nipple tenderness. Skin intact. Patient Name: [name]'BToday's Date: 12/10/2015 Reason for consult: Follow-up assessment;Infant weight loss;Breast/nipple pain;Hyperbilirubinemia   Maternal Data    Feeding Feeding Type: Breast Fed Length of feed: 10 min  LATCH Score/Interventions Latch: Repeated attempts needed to sustain latch, nipple held in mouth throughout feeding, stimulation needed to elicit sucking reflex. Intervention(s): Teach feeding cues;Waking techniques Intervention(s): Adjust position;Assist with latch;Breast massage;Breast compression  Audible Swallowing: A few with stimulation Intervention(s): Skin to skin;Hand expression Intervention(s): Alternate breast massage;Hand expression  Type of Nipple: Everted at rest and after stimulation  Comfort (Breast/Nipple): Filling, red/small blisters or bruises, mild/mod discomfort  Problem noted: Filling;Mild/Moderate discomfort Interventions (Filling): Massage;Frequent nursing;Double electric pump Interventions (Mild/moderate discomfort): Hand massage;Hand expression;Post-pump  Hold (Positioning): Assistance needed to correctly position infant at breast and maintain latch. Intervention(s): Breastfeeding basics reviewed;Support Pillows;Position options  LATCH Score: 6  Lactation Tools  Discussed/Used Tools: Pump Breast pump type: Double-Electric Breast Pump   Consult Status Consult Status: Follow-up Date: 12/10/15 Follow-up type: In-patient    [name], Diamond NickelLAURA G 12/10/2015, 3:01 AM

## 2015-12-10 NOTE — Lactation Note (Signed)
This note was copied from a baby's chart. Lactation Consultation Note  Patient Name: Girl William HamburgerJazmine Clymer ZOXWR'UToday's Date: 12/10/2015 Reason for consult: Follow-up assessment  Baby 53 hours old. Mom just finishing nursing baby in cradle position when this LC entered the room. Offered to assist mom with latching several times, but mom declined. Mom reports that she is not comfortable with football position, so enc mom to support baby's head while she is at the breast in cradle position. Mom denies nipple pain. Mom states that she has been supplementing EBM with spoon. Assisted parents to supplementing 8 ml of EBM using syringe and finger and baby tolerated well. Enc mom to put baby to breast first, then supplement with EBM/formula according to guidelines--which were reviewed. Enc mom to post-pump after each feeding using DEBP. Demonstrated to mom how to use piston to pump both breast simultaneously. Mom used hand pump for 15 minutes, and then decided that she would like to rent a DEBP, so she was given paperwork and enc to call when ready for pump.  Referred parents to Baby and Me booklet for number of diapers to expect by day of life and EBM storage guidelines. Offered an outpatient appointment, but mom declined. Mom aware of OP/BFSG and LC phone line assistance after D/C.    Maternal Data    Feeding Feeding Type: Breast Milk  LATCH Score/Interventions                      Lactation Tools Discussed/Used     Consult Status Consult Status: PRN    Sherlyn HayJennifer D Krislynn Gronau 12/10/2015, 11:41 AM

## 2017-02-11 ENCOUNTER — Emergency Department (HOSPITAL_COMMUNITY): Admission: EM | Admit: 2017-02-11 | Discharge: 2017-02-11 | Discharge status: Absent without leave | Payer: 59

## 2017-06-23 ENCOUNTER — Other Ambulatory Visit (HOSPITAL_COMMUNITY): Payer: Self-pay | Admitting: Family Medicine

## 2017-06-23 DIAGNOSIS — R229 Localized swelling, mass and lump, unspecified: Principal | ICD-10-CM

## 2017-06-23 DIAGNOSIS — IMO0002 Reserved for concepts with insufficient information to code with codable children: Secondary | ICD-10-CM

## 2017-07-01 ENCOUNTER — Encounter (HOSPITAL_COMMUNITY): Admission: RE | Admit: 2017-07-01 | Payer: Medicaid Other | Source: Ambulatory Visit

## 2017-07-01 ENCOUNTER — Ambulatory Visit (HOSPITAL_COMMUNITY): Payer: Medicaid Other | Attending: Family Medicine

## 2017-07-01 ENCOUNTER — Encounter (HOSPITAL_COMMUNITY): Payer: Self-pay

## 2017-07-02 ENCOUNTER — Encounter (HOSPITAL_COMMUNITY): Payer: Medicaid Other

## 2017-08-10 ENCOUNTER — Encounter (HOSPITAL_COMMUNITY): Payer: 59

## 2017-08-10 ENCOUNTER — Ambulatory Visit (HOSPITAL_COMMUNITY): Payer: 59 | Attending: Family Medicine

## 2017-08-11 ENCOUNTER — Encounter (HOSPITAL_COMMUNITY): Payer: Medicaid Other | Attending: Family Medicine

## 2018-03-20 ENCOUNTER — Encounter (HOSPITAL_COMMUNITY): Payer: Self-pay | Admitting: Emergency Medicine

## 2018-03-20 ENCOUNTER — Emergency Department (HOSPITAL_COMMUNITY): Payer: Medicaid Other

## 2018-03-20 ENCOUNTER — Emergency Department (HOSPITAL_COMMUNITY)
Admission: EM | Admit: 2018-03-20 | Discharge: 2018-03-20 | Disposition: A | Discharge status: Home / Self Care | Payer: Medicaid Other | Attending: Emergency Medicine | Admitting: Emergency Medicine

## 2018-03-20 ENCOUNTER — Other Ambulatory Visit: Payer: Self-pay

## 2018-03-20 DIAGNOSIS — Z87891 Personal history of nicotine dependence: Secondary | ICD-10-CM | POA: Insufficient documentation

## 2018-03-20 DIAGNOSIS — Z79899 Other long term (current) drug therapy: Secondary | ICD-10-CM | POA: Diagnosis not present

## 2018-03-20 DIAGNOSIS — R109 Unspecified abdominal pain: Secondary | ICD-10-CM | POA: Insufficient documentation

## 2018-03-20 LAB — COMPREHENSIVE METABOLIC PANEL
ALT: 8 U/L (ref 0–44)
AST: 14 U/L — ABNORMAL LOW (ref 15–41)
Albumin: 3.9 g/dL (ref 3.5–5.0)
Alkaline Phosphatase: 43 U/L (ref 38–126)
Anion gap: 5 (ref 5–15)
BUN: 9 mg/dL (ref 6–20)
CALCIUM: 9.1 mg/dL (ref 8.9–10.3)
CHLORIDE: 105 mmol/L (ref 98–111)
CO2: 24 mmol/L (ref 22–32)
CREATININE: 0.66 mg/dL (ref 0.44–1.00)
GFR calc Af Amer: >60 mL/min (ref >60)
GFR calc non Af Amer: >60 mL/min (ref >60)
GLUCOSE: 90 mg/dL (ref 70–99)
Potassium: 3.4 mmol/L — ABNORMAL LOW (ref 3.5–5.1)
Sodium: 134 mmol/L — ABNORMAL LOW (ref 135–145)
Total Bilirubin: 1.3 mg/dL — ABNORMAL HIGH (ref 0.3–1.2)
Total Protein: 7.4 g/dL (ref 6.5–8.1)

## 2018-03-20 LAB — CBC WITH DIFFERENTIAL/PLATELET
Abs Immature Granulocytes: 0.02 10*3/uL (ref 0.00–0.07)
Basophils Absolute: 0 10*3/uL (ref 0.0–0.1)
Basophils Relative: 0 %
Eosinophils Absolute: 0.3 10*3/uL (ref 0.0–0.5)
Eosinophils Relative: 3 %
HCT: 36.9 % (ref 36.0–46.0)
Hemoglobin: 12 g/dL (ref 12.0–15.0)
Immature Granulocytes: 0 %
Lymphocytes Relative: 26 %
Lymphs Abs: 2.6 10*3/uL (ref 0.7–4.0)
MCH: 27.6 pg (ref 26.0–34.0)
MCHC: 32.5 g/dL (ref 30.0–36.0)
MCV: 84.8 fL (ref 80.0–100.0)
Monocytes Absolute: 0.7 10*3/uL (ref 0.1–1.0)
Monocytes Relative: 7 %
Neutro Abs: 6.4 10*3/uL (ref 1.7–7.7)
Neutrophils Relative %: 64 %
Platelets: 240 10*3/uL (ref 150–400)
RBC: 4.35 MIL/uL (ref 3.87–5.11)
RDW: 12.4 % (ref 11.5–15.5)
WBC: 10 10*3/uL (ref 4.0–10.5)
nRBC: 0 % (ref 0.0–0.2)

## 2018-03-20 LAB — URINALYSIS, ROUTINE W REFLEX MICROSCOPIC
Bilirubin Urine: NEGATIVE
Glucose, UA: NEGATIVE mg/dL
Hgb urine dipstick: NEGATIVE
Ketones, ur: NEGATIVE mg/dL
Leukocytes,Ua: NEGATIVE
Nitrite: NEGATIVE
Protein, ur: NEGATIVE mg/dL
Specific Gravity, Urine: 1.03 (ref 1.005–1.030)
pH: 5 (ref 5.0–8.0)

## 2018-03-20 LAB — I-STAT BETA HCG BLOOD, ED (MC, WL, AP ONLY): I-stat hCG, quantitative: <5 m[IU]/mL (ref <5)

## 2018-03-20 LAB — LIPASE, BLOOD: LIPASE: 60 U/L — AB (ref 11–51)

## 2018-03-20 MED ORDER — DICYCLOMINE HCL 20 MG PO TABS: 20.0000 mg | ORAL_TABLET | Freq: Two times a day (BID) | ORAL | 0 refills | Status: DC | PRN

## 2018-03-20 MED ORDER — ONDANSETRON 4 MG PO TBDP: 4.0000 mg | ORAL_TABLET | Freq: Three times a day (TID) | ORAL | 0 refills | Status: DC | PRN

## 2018-03-20 MED ORDER — IOPAMIDOL (ISOVUE-M 300) INJECTION 61%: 15.0000 mL | Freq: Once | INTRAMUSCULAR | Status: DC | PRN

## 2018-03-20 MED ORDER — IOHEXOL 300 MG/ML  SOLN
100.0000 mL | Freq: Once | INTRAMUSCULAR | Status: AC | PRN
  Administered 2018-03-20: 100 mL via INTRAVENOUS

## 2018-03-20 MED ORDER — KETOROLAC TROMETHAMINE 30 MG/ML IJ SOLN: 30.0000 mg | Freq: Once | INTRAMUSCULAR | Status: DC

## 2018-03-20 NOTE — ED Provider Notes (Signed)
[facility]   CSN: [id number] Arrival date & time: 03/20/18  0907    History   Chief Complaint Chief Complaint  Patient presents with  . Abdominal Pain  . Diarrhea    HPI Samantha Blair is a 23 y.o. female.     The history is provided by the patient and medical records. No language interpreter was used.  Abdominal Pain  Associated symptoms: diarrhea and nausea   Associated symptoms: no vomiting   Diarrhea  Associated symptoms: abdominal pain   Associated symptoms: no vomiting     Samantha Blair is a 23 y.o. female  with a PMH of thyroid goiter which she has not followed up for as well as abnormal pap without adequate follow up who presents to the Emergency Department complaining of periumbilical abdominal pain x 5 days. Gradually worsening.  Associated with nausea, however does not feel nauseous currently.  Had an episode of diarrhea last night without blood.  No further loose stools.  She also endorses feeling bloated.  No vomiting or fevers.  Denies any dysuria, urinary urgency or frequency.  Went to use the restroom this morning and felt like she really needed to urinate, however did not urinary very much -a few drops.  Nuys any history of similar.  No previous abdominal surgeries other than a cesarean.  She took a Bentyl which was a friend's and did provide her with adequate relief for a couple hours.  No other medications taken for sxs.   Past Medical History:  Diagnosis Date  . Goiter    left side of neck    Patient Active Problem List   Diagnosis Date Noted  . VBAC (vaginal birth after Cesarean) 12/09/2015  . Normal labor 12/08/2015  . Thyroid enlarged 10/25/2015    Past Surgical History:  Procedure Laterality Date  . CESAREAN SECTION       OB History    Gravida  2   Para  2   Term  1   Preterm  1   AB  0   Living  3     SAB      TAB      Ectopic      Multiple  1   Live Births  3              Home Medications    Prior to Admission medications   Medication Sig Start Date End Date Taking? Authorizing Provider  acetaminophen (TYLENOL) 500 MG tablet Take 500 mg by mouth every 6 (six) hours as needed.    [provider]  dicyclomine (BENTYL) 20 MG tablet Take 1 tablet (20 mg total) by mouth 2 (two) times daily as needed (abdominal cramping). 03/20/18   Tessia Kassin, Chase PicketJaime Pilcher, PA-C  docusate sodium (COLACE) 100 MG capsule Take 1 capsule (100 mg total) by mouth 2 (two) times daily. 12/09/15   Mumaw, Hiram ComberElizabeth Woodland, DO  ibuprofen (ADVIL,MOTRIN) 600 MG tablet Take 1 tablet (600 mg total) by mouth every 6 (six) hours. 12/09/15   Mumaw, Hiram ComberElizabeth Woodland, DO  ondansetron (ZOFRAN ODT) 4 MG disintegrating tablet Take 1 tablet (4 mg total) by mouth every 8 (eight) hours as needed for nausea or vomiting. 03/20/18   Lorieann Argueta, Chase PicketJaime Pilcher, PA-C  Prenatal Vit-Fe Fumarate-FA (PNV PRENATAL PLUS MULTIVITAMIN) 27-1 MG TABS Take 1 tablet by mouth daily.  09/27/15   [provider]    Family History Family History  Problem Relation Age of Onset  .  Cancer Maternal Grandmother   . Cancer Maternal Grandfather     Social History Social History   Tobacco Use  . Smoking status: Former Games developer  . Smokeless tobacco: Never Used  Substance Use Topics  . Alcohol use: No  . Drug use: No     Allergies   Penicillins   Review of Systems Review of Systems  Gastrointestinal: Positive for abdominal pain, diarrhea and nausea. Negative for blood in stool and vomiting.  All other systems reviewed and are negative.    Physical Exam Updated Vital Signs BP 110/76 (BP Location: Right Arm)   Pulse 69   Temp 98.3 F (36.8 C) (Oral)   Resp 18   Wt 68 kg   LMP 02/22/2018   SpO2 97%   BMI 24.21 kg/m   Physical Exam Vitals signs and nursing note reviewed.  Constitutional:      General: She is not in acute distress.    Appearance: She is well-developed.  HENT:     Head:  Normocephalic and atraumatic.  Neck:     Musculoskeletal: Neck supple.  Cardiovascular:     Rate and Rhythm: Normal rate and regular rhythm.     Heart sounds: Normal heart sounds. No murmur.  Pulmonary:     Effort: Pulmonary effort is normal. No respiratory distress.     Breath sounds: Normal breath sounds.  Abdominal:     General: There is no distension.     Palpations: Abdomen is soft.     Comments: Periumbilical and RLQ tenderness. Negative Murphy's. No CVA tenderness.  Skin:    General: Skin is warm and dry.  Neurological:     Mental Status: She is alert and oriented to person, place, and time.      ED Treatments / Results  Labs (all labs ordered are listed, but only abnormal results are displayed) Labs Reviewed  LIPASE, BLOOD - Abnormal; Notable for the following components:      Result Value   Lipase 60 (*)    All other components within normal limits  COMPREHENSIVE METABOLIC PANEL - Abnormal; Notable for the following components:   Sodium 134 (*)    Potassium 3.4 (*)    AST 14 (*)    Total Bilirubin 1.3 (*)    All other components within normal limits  CBC WITH DIFFERENTIAL/PLATELET  URINALYSIS, ROUTINE W REFLEX MICROSCOPIC  I-STAT BETA HCG BLOOD, ED ([facility], [facility], [facility] ONLY)    EKG None  Radiology Ct Abdomen Pelvis W Contrast  Result Date: 03/20/2018 CLINICAL DATA:  Generalized abdominal pain and cramping for 4 days. EXAM: CT ABDOMEN AND PELVIS WITH CONTRAST TECHNIQUE: Multidetector CT imaging of the abdomen and pelvis was performed using the standard protocol following bolus administration of intravenous contrast. CONTRAST:  OMNIPAQUE IOHEXOL 300 MG/ML  SOLN COMPARISON:  None. FINDINGS: Lower chest: No acute abnormality. Hepatobiliary: No focal liver abnormality is seen. No gallstones, gallbladder wall thickening, or biliary dilatation. Pancreas: Unremarkable. No pancreatic ductal dilatation or surrounding inflammatory changes. Spleen: Normal in size without focal  abnormality. Adrenals/Urinary Tract: Adrenal glands are unremarkable. Kidneys are normal, without renal calculi, focal lesion, or hydronephrosis. Bladder is unremarkable. Stomach/Bowel: Stomach is within normal limits. Appendix appears normal. No evidence of bowel wall thickening, distention, or inflammatory changes. Vascular/Lymphatic: No significant vascular findings are present. No enlarged abdominal or pelvic lymph nodes. Reproductive: Uterus and bilateral adnexa are unremarkable. Retroverted uterus. Other: No abdominal wall hernia or abnormality. Small amount of free fluid in the pelvis. Musculoskeletal: No acute  or significant osseous findings. IMPRESSION: No evidence of acute abnormalities within the abdomen or pelvis. Small amount of free fluid in the pelvis, possibly trivial. Patient's urinary bladder is empty. Electronically Signed   By: Ted Mcalpine M.D.   On: 03/20/2018 11:02    Procedures Procedures (including critical care time)  Medications Ordered in ED Medications  ketorolac (TORADOL) 30 MG/ML injection 30 mg (30 mg Intravenous Not Given 03/20/18 1139)  iohexol (OMNIPAQUE) 300 MG/ML solution 100 mL (100 mLs Intravenous Contrast Given 03/20/18 1041)     Initial Impression / Assessment and Plan / ED Course  I have reviewed the triage vital signs and the nursing notes.  Pertinent labs & imaging results that were available during my care of the patient were reviewed by me and considered in my medical decision making (see chart for details).  Clinical Course as of Mar 21 1143  Sat Mar 20, 2018  1017 Comment 3:        [SJ]  1018 I-stat hCG, quantitative: <5.0 [SJ]    Clinical Course User Index [SJ] Daivd Council, Student-PA      Samantha Blair is a 23 y.o. female who presents to ED for periumbilical abdominal pain which began a few days ago and has been progressively worsening.  On exam, patient is afebrile and hemodynamically stable with tenderness to the  periumbilical and right lower quadrant.  Negative Murphy sign.  Labs reviewed and notable for lipase of 60.  White count of 10.  History not is consistent with pancreatitis would expect lipase to be much higher than 60 should this be pancreatitis.  UA without signs of infection or blood.  CT scan reassuring with no acute findings.  No appendicitis.  Symptomatic home care instructions discussed.  Prescription for Zofran and Bentyl given.  PCP follow-up encouraged.  Reasons to return to the emergency department were discussed and all questions answered.   Final Clinical Impressions(s) / ED Diagnoses   Final diagnoses:  Abdominal pain, unspecified abdominal location    ED Discharge Orders         Ordered    dicyclomine (BENTYL) 20 MG tablet  2 times daily PRN     03/20/18 1129    ondansetron (ZOFRAN ODT) 4 MG disintegrating tablet  Every 8 hours PRN     03/20/18 1129           Harlea Goetzinger, Chase Picket, PA-C 03/20/18 1145    Bethann Berkshire, MD 03/21/18 0700

## 2018-03-20 NOTE — Discharge Instructions (Signed)
It was my pleasure taking care of you today!   Fortunately, your lab work and imaging was very reassuring. Zofran as needed for nausea. Bentyl as needed for abdominal cramping.  Follow up with your primary care doctor. Call them Monday morning to schedule a follow up appointment.   It is important that you monitor your symptoms and return to the Emergency Department if you develop any of the following symptoms:  You have a fever.  You keep throwing up and can't keep fluids down. You pass bloody or black tarry stools.  There is bright red blood in the stool. You do not seem to be getting better in 1 week. New symptoms develop. You have any questions or concerns.

## 2018-03-20 NOTE — ED Triage Notes (Signed)
Pt in from home with c/o generalized abdominal cramping x 4 days, also having diarrhea. States she feels unable to empty bladder when voiding.

## 2019-08-03 ENCOUNTER — Telehealth: Payer: Medicaid Other | Admitting: Internal Medicine

## 2019-08-05 ENCOUNTER — Encounter (HOSPITAL_COMMUNITY): Payer: Self-pay | Admitting: Emergency Medicine

## 2019-08-05 ENCOUNTER — Inpatient Hospital Stay (HOSPITAL_COMMUNITY)
Admission: AD | Admit: 2019-08-05 | Discharge: 2019-08-06 | Disposition: A | Discharge status: Home / Self Care | Payer: Medicaid Other | Attending: Emergency Medicine | Admitting: Emergency Medicine

## 2019-08-05 ENCOUNTER — Other Ambulatory Visit: Payer: Self-pay

## 2019-08-05 DIAGNOSIS — D62 Acute posthemorrhagic anemia: Secondary | ICD-10-CM | POA: Diagnosis not present

## 2019-08-05 DIAGNOSIS — O26859 Spotting complicating pregnancy, unspecified trimester: Secondary | ICD-10-CM | POA: Diagnosis present

## 2019-08-05 DIAGNOSIS — O074 Failed attempted termination of pregnancy without complication: Secondary | ICD-10-CM

## 2019-08-05 DIAGNOSIS — Z3A00 Weeks of gestation of pregnancy not specified: Secondary | ICD-10-CM | POA: Insufficient documentation

## 2019-08-05 DIAGNOSIS — O4690 Antepartum hemorrhage, unspecified, unspecified trimester: Secondary | ICD-10-CM

## 2019-08-05 DIAGNOSIS — O0489 (Induced) termination of pregnancy with other complications: Secondary | ICD-10-CM | POA: Diagnosis not present

## 2019-08-05 HISTORY — DX: Complete or unspecified spontaneous abortion without complication: O03.9

## 2019-08-05 HISTORY — DX: Reserved for inherently not codable concepts without codable children: IMO0001

## 2019-08-05 LAB — COMPREHENSIVE METABOLIC PANEL
ALT: 7 U/L (ref 0–44)
AST: 17 U/L (ref 15–41)
Albumin: 3.8 g/dL (ref 3.5–5.0)
Alkaline Phosphatase: 36 U/L — ABNORMAL LOW (ref 38–126)
Anion gap: 8 (ref 5–15)
BUN: 9 mg/dL (ref 6–20)
CO2: 25 mmol/L (ref 22–32)
Calcium: 9.1 mg/dL (ref 8.9–10.3)
Chloride: 103 mmol/L (ref 98–111)
Creatinine, Ser: 0.73 mg/dL (ref 0.44–1.00)
GFR calc Af Amer: >60 mL/min (ref >60)
GFR calc non Af Amer: >60 mL/min (ref >60)
Glucose, Bld: 93 mg/dL (ref 70–99)
Potassium: 3.3 mmol/L — ABNORMAL LOW (ref 3.5–5.1)
Sodium: 136 mmol/L (ref 135–145)
Total Bilirubin: 1 mg/dL (ref 0.3–1.2)
Total Protein: 7.6 g/dL (ref 6.5–8.1)

## 2019-08-05 LAB — CBC WITH DIFFERENTIAL/PLATELET
Abs Immature Granulocytes: 0.01 10*3/uL (ref 0.00–0.07)
Basophils Absolute: 0 10*3/uL (ref 0.0–0.1)
Basophils Relative: 1 %
Eosinophils Absolute: 0.3 10*3/uL (ref 0.0–0.5)
Eosinophils Relative: 4 %
HCT: 32.8 % — ABNORMAL LOW (ref 36.0–46.0)
Hemoglobin: 10.7 g/dL — ABNORMAL LOW (ref 12.0–15.0)
Immature Granulocytes: 0 %
Lymphocytes Relative: 24 %
Lymphs Abs: 1.8 10*3/uL (ref 0.7–4.0)
MCH: 28.2 pg (ref 26.0–34.0)
MCHC: 32.6 g/dL (ref 30.0–36.0)
MCV: 86.5 fL (ref 80.0–100.0)
Monocytes Absolute: 0.5 10*3/uL (ref 0.1–1.0)
Monocytes Relative: 6 %
Neutro Abs: 5.1 10*3/uL (ref 1.7–7.7)
Neutrophils Relative %: 65 %
Platelets: 276 10*3/uL (ref 150–400)
RBC: 3.79 MIL/uL — ABNORMAL LOW (ref 3.87–5.11)
RDW: 13.1 % (ref 11.5–15.5)
WBC: 7.8 10*3/uL (ref 4.0–10.5)
nRBC: 0 % (ref 0.0–0.2)

## 2019-08-05 LAB — I-STAT BETA HCG BLOOD, ED (MC, WL, AP ONLY): I-stat hCG, quantitative: 802.3 m[IU]/mL — ABNORMAL HIGH (ref <5)

## 2019-08-05 NOTE — ED Triage Notes (Signed)
Patient reports vaginal bleeding with abdominal cramping this week , no diarrhea or emesis , patient stated abortion procedure last month .

## 2019-08-06 ENCOUNTER — Inpatient Hospital Stay (HOSPITAL_COMMUNITY): Payer: Medicaid Other

## 2019-08-06 ENCOUNTER — Encounter (HOSPITAL_COMMUNITY): Payer: Self-pay | Admitting: Family Medicine

## 2019-08-06 DIAGNOSIS — O0489 (Induced) termination of pregnancy with other complications: Secondary | ICD-10-CM

## 2019-08-06 DIAGNOSIS — D62 Acute posthemorrhagic anemia: Secondary | ICD-10-CM

## 2019-08-06 LAB — CBC
HCT: 29.1 % — ABNORMAL LOW (ref 36.0–46.0)
Hemoglobin: 9.4 g/dL — ABNORMAL LOW (ref 12.0–15.0)
MCH: 28.1 pg (ref 26.0–34.0)
MCHC: 32.3 g/dL (ref 30.0–36.0)
MCV: 86.9 fL (ref 80.0–100.0)
Platelets: 287 10*3/uL (ref 150–400)
RBC: 3.35 MIL/uL — ABNORMAL LOW (ref 3.87–5.11)
RDW: 13.1 % (ref 11.5–15.5)
WBC: 10 10*3/uL (ref 4.0–10.5)
nRBC: 0 % (ref 0.0–0.2)

## 2019-08-06 LAB — URINALYSIS, ROUTINE W REFLEX MICROSCOPIC
Bacteria, UA: NONE SEEN
Bilirubin Urine: NEGATIVE
Glucose, UA: NEGATIVE mg/dL
Ketones, ur: NEGATIVE mg/dL
Leukocytes,Ua: NEGATIVE
Nitrite: NEGATIVE
Protein, ur: >=300 mg/dL — AB
RBC / HPF: >50 RBC/hpf — ABNORMAL HIGH (ref 0–5)
Specific Gravity, Urine: 1.029 (ref 1.005–1.030)
WBC, UA: >50 WBC/hpf — ABNORMAL HIGH (ref 0–5)
pH: 6 (ref 5.0–8.0)

## 2019-08-06 LAB — TYPE AND SCREEN
ABO/RH(D): O POS
Antibody Screen: NEGATIVE

## 2019-08-06 LAB — HCG, QUANTITATIVE, PREGNANCY: hCG, Beta Chain, Quant, S: 1053 m[IU]/mL — ABNORMAL HIGH (ref <5)

## 2019-08-06 MED ORDER — FERROUS GLUCONATE 324 (38 FE) MG PO TABS
324.0000 mg | ORAL_TABLET | Freq: Once | ORAL | Status: AC
  Administered 2019-08-06: 324 mg via ORAL
  Filled 2019-08-06: qty 1

## 2019-08-06 MED ORDER — MISOPROSTOL 200 MCG PO TABS
800.0000 ug | ORAL_TABLET | Freq: Once | ORAL | Status: DC
  Filled 2019-08-06: qty 4

## 2019-08-06 MED ORDER — FERROUS SULFATE 325 (65 FE) MG PO TABS: 325.0000 mg | ORAL_TABLET | Freq: Two times a day (BID) | ORAL | 0 refills | Status: DC

## 2019-08-06 MED ORDER — LACTATED RINGERS IV BOLUS
1000.0000 mL | Freq: Once | INTRAVENOUS | Status: AC
  Administered 2019-08-06: 1000 mL via INTRAVENOUS

## 2019-08-06 MED ORDER — MISOPROSTOL 200 MCG PO TABS
800.0000 ug | ORAL_TABLET | Freq: Once | ORAL | Status: AC
  Administered 2019-08-06: 800 ug via VAGINAL

## 2019-08-06 NOTE — ED Provider Notes (Signed)
MSE was initiated and I personally evaluated the patient and placed orders (if any) at  1:57 AM on August 06, 2019.  The patient appears stable so that the remainder of the MSE may be completed by another provider.  24 year old female with a history of cesarean section who presents to the emergency department with a chief complaint of vaginal bleeding.  June 12- Patient was seen by a clinician to initiate a medical abortion and was given a pill in the clinic.  She reports that after taking the initial pill that she had some vaginal bleeding with large clots.  She was instructed to take 2 pills 24 hours later, but reports that she vomited almost immediately after taking the 2 pills at home.   June 14- She called the clinic back and expressed concerns about vomiting after taking the medication and was seen again in the clinic on June 14 and completed intravaginal medications.  She reports that she initially had a very heavy bleeding with clots for 2 days after the intravaginal medication.  After that time, the bleeding is slowed down, but spotting has persisted.  June 30- She began having heavy bleeding again and passing lots of clots that then progressively decreased to spotting until today.  June 6- Patient reports this is the last time that she had sexual intercourse.  Protection was not used.  Although spotting has continued, she reports that tonight that she felt a large gush of blood after getting out of the shower that was accompanied by severe, diffuse abdominal cramping, dizziness, and lightheadedness, which prompted her to come to the ER.   No fever, chills, shortness of breath, vomiting, diarrhea, vaginal discharge.   Vital signs are stable.  Diffuse tenderness palpation to the abdomen without rebound or guarding.  Lungs are clear to auscultation bilaterally.  Heart is regular rate and rhythm.  No pallor.  She appears anxious.   I-STAT hCG is 802.  UA with 3+ proteinuria and  hemoglobinuria.  She has mild hypokalemia.  Given her symptoms, differential diagnosis includes incomplete abortion, ectopic pregnancy, spontaneous abortion, menstrual cycle following complete abortion, or choriocarcinoma.  Spoke with Suzette Battiest, MAU clinician, who will accept the patient as a transfer to the MAU as the patient will require a pelvic exam and pelvic ultrasound.  Type and screen and quantitative hCG have been ordered.  At time of transfer, she is hemodynamically stable and in no acute distress.  Safe for transfer to the MAU at this time.   Barkley Boards, PA-C 08/06/19 0157    Glynn Octave, MD 08/06/19 [phone]

## 2019-08-06 NOTE — Discharge Instructions (Signed)
Anemia  Anemia is a condition in which you do not have enough red blood cells or hemoglobin. Hemoglobin is a substance in red blood cells that carries oxygen. When you do not have enough red blood cells or hemoglobin (are anemic), your body cannot get enough oxygen and your organs may not work properly. As a result, you may feel very tired or have other problems. What are the causes? Common causes of anemia include:  Excessive bleeding. Anemia can be caused by excessive bleeding inside or outside the body, including bleeding from the intestine or from periods in women.  Poor nutrition.  Long-lasting (chronic) kidney, thyroid, and liver disease.  Bone marrow disorders.  Cancer and treatments for cancer.  HIV (human immunodeficiency virus) and AIDS (acquired immunodeficiency syndrome).  Treatments for HIV and AIDS.  Spleen problems.  Blood disorders.  Infections, medicines, and autoimmune disorders that destroy red blood cells. What are the signs or symptoms? Symptoms of this condition include:  Minor weakness.  Dizziness.  Headache.  Feeling heartbeats that are irregular or faster than normal (palpitations).  Shortness of breath, especially with exercise.  Paleness.  Cold sensitivity.  Indigestion.  Nausea.  Difficulty sleeping.  Difficulty concentrating. Symptoms may occur suddenly or develop slowly. If your anemia is mild, you may not have symptoms. How is this diagnosed? This condition is diagnosed based on:  Blood tests.  Your medical history.  A physical exam.  Bone marrow biopsy. Your health care provider may also check your stool (feces) for blood and may do additional testing to look for the cause of your bleeding. You may also have other tests, including:  Imaging tests, such as a CT scan or MRI.  Endoscopy.  Colonoscopy. How is this treated? Treatment for this condition depends on the cause. If you continue to lose a lot of blood, you may  need to be treated at a hospital. Treatment may include:  Taking supplements of iron, vitamin J28, or folic acid.  Taking a hormone medicine (erythropoietin) that can help to stimulate red blood cell growth.  Having a blood transfusion. This may be needed if you lose a lot of blood.  Making changes to your diet.  Having surgery to remove your spleen. Follow these instructions at home:  Take over-the-counter and prescription medicines only as told by your health care provider.  Take supplements only as told by your health care provider.  Follow any diet instructions that you were given.  Keep all follow-up visits as told by your health care provider. This is important. Contact a health care provider if:  You develop new bleeding anywhere in the body. Get help right away if:  You are very weak.  You are short of breath.  You have pain in your abdomen or chest.  You are dizzy or feel faint.  You have trouble concentrating.  You have bloody or black, tarry stools.  You vomit repeatedly or you vomit up blood. Summary  Anemia is a condition in which you do not have enough red blood cells or enough of a substance in your red blood cells that carries oxygen (hemoglobin).  Symptoms may occur suddenly or develop slowly.  If your anemia is mild, you may not have symptoms.  This condition is diagnosed with blood tests as well as a medical history and physical exam. Other tests may be needed.  Treatment for this condition depends on the cause of the anemia. This information is not intended to replace advice given to you by  your health care provider. Make sure you discuss any questions you have with your health care provider. Document Revised: 12/26/2016 Document Reviewed: 02/15/2016 Elsevier Patient Education  [employer].

## 2019-08-06 NOTE — MAU Provider Note (Addendum)
History     CSN: [id number]  Arrival date and time: 08/05/19 2230   First Provider Initiated Contact with Patient 08/06/19 [phone]      Chief Complaint  Patient presents with  . Abdominal Cramping  . Vaginal Bleeding   Samantha Blair is a 24 y.o. G3P3 who presents to MAU with complaints of vaginal bleeding. Patient reports having a TAB on 6/12. Patient was seen by a clinician to initiate a medical abortion and was given a pill in the clinic.  She reports that after taking the initial pill that she had some vaginal bleeding with large clots.  She was instructed to take 2 pills 24 hours later, but reports that she vomited almost immediately after taking the 2 pills at home.   On June 14th, She called the clinic back and expressed concerns about vomiting after taking the medication and was seen again in the clinic on June 14 and completed intravaginal medications.  She reports that she initially had a very heavy bleeding with clots for 2 days after the intravaginal medication.  After that time, the bleeding is slowed down, but spotting has persisted.  She reports that since June 30 she has continued to have vaginal bleeding and started passing large clots, lightheadedness and dizziness on Thursday.       OB History    Gravida  3   Para  2   Term  1   Preterm  1   AB  1   Living  3     SAB      TAB  1   Ectopic      Multiple  1   Live Births  3           Past Medical History:  Diagnosis Date  . Abortion   . Goiter    left side of neck    Past Surgical History:  Procedure Laterality Date  . CESAREAN SECTION      Family History  Problem Relation Age of Onset  . Cancer Maternal Grandmother   . Cancer Maternal Grandfather     Social History   Tobacco Use  . Smoking status: Former Games developer  . Smokeless tobacco: Never Used  Substance Use Topics  . Alcohol use: No  . Drug use: No    Allergies:  Allergies  Allergen Reactions  . Penicillins Hives     Has patient had a PCN reaction causing immediate rash, facial/tongue/throat swelling, SOB or lightheadedness with hypotension: Yes Has patient had a PCN reaction causing severe rash involving mucus membranes or skin necrosis: Yes Has patient had a PCN reaction that required hospitalization Yes Has patient had a PCN reaction occurring within the last 10 years: No If all of the above answers are "NO", then may proceed with Cephalosporin use.     Medications Prior to Admission  Medication Sig Dispense Refill Last Dose  . acetaminophen (TYLENOL) 500 MG tablet Take 500 mg by mouth every 6 (six) hours as needed.     . dicyclomine (BENTYL) 20 MG tablet Take 1 tablet (20 mg total) by mouth 2 (two) times daily as needed (abdominal cramping). 20 tablet 0   . docusate sodium (COLACE) 100 MG capsule Take 1 capsule (100 mg total) by mouth 2 (two) times daily. 30 capsule 0   . ibuprofen (ADVIL,MOTRIN) 600 MG tablet Take 1 tablet (600 mg total) by mouth every 6 (six) hours. 30 tablet 0   . ondansetron (ZOFRAN ODT) 4 MG disintegrating tablet Take  1 tablet (4 mg total) by mouth every 8 (eight) hours as needed for nausea or vomiting. 20 tablet 0   . Prenatal Vit-Fe Fumarate-FA (PNV PRENATAL PLUS MULTIVITAMIN) 27-1 MG TABS Take 1 tablet by mouth daily.   6     Review of Systems  Constitutional: Negative.   Respiratory: Negative.   Cardiovascular: Negative.   Gastrointestinal: Negative for abdominal pain, constipation, diarrhea, nausea and vomiting.  Genitourinary: Positive for vaginal bleeding. Negative for difficulty urinating, dysuria, frequency, urgency and vaginal discharge.  Musculoskeletal: Negative.   Neurological: Positive for dizziness and light-headedness. Negative for syncope, weakness and headaches.  Psychiatric/Behavioral: Negative.    Physical Exam   Blood pressure 112/75, pulse 80, temperature 98.5 F (36.9 C), resp. rate 19, height 5\' 5"  (1.651 m), weight 75 kg, SpO2 100 %, unknown if  currently breastfeeding.  Physical Exam Exam conducted with a chaperone present.  HENT:     Head: Normocephalic.  Cardiovascular:     Rate and Rhythm: Normal rate and regular rhythm.  Pulmonary:     Effort: Pulmonary effort is normal. No respiratory distress.     Breath sounds: Normal breath sounds. No wheezing.  Abdominal:     Palpations: Abdomen is soft.  Genitourinary:    Exam position: Lithotomy position.     Vagina: Bleeding present.     Cervix: Dilated.     Comments: Pelvic exam: Cervix pink, visually open with some POC and clots in os- used sponge stick to remove POC and large clots, without lesion, large amount of dark red vaginal bleeding plus clots (8 faux swabs used), vaginal walls and external genitalia normal Neurological:     Mental Status: She is alert and oriented to person, place, and time.  Psychiatric:        Mood and Affect: Mood normal.        Behavior: Behavior normal.        Thought Content: Thought content normal.    MAU Course  Procedures  MDM Initial labs obtained in [facility] prior to transfer to MAU  Initial CBC 10.7, HCG 1,053 ordered upon arrival to MAU   Labs and [place] report reviewed:  Results for orders placed or performed during the hospital encounter of 08/05/19 (from the past 24 hour(s))  CBC with Differential     Status: Abnormal   Collection Time: 08/05/19 10:59 PM  Result Value Ref Range   WBC 7.8 4.0 - 10.5 K/uL   RBC 3.79 (L) 3.87 - 5.11 MIL/uL   Hemoglobin 10.7 (L) 12.0 - 15.0 g/dL   HCT 10/06/19 (L) 36 - 46 %   MCV 86.5 80.0 - 100.0 fL   MCH 28.2 26.0 - 34.0 pg   MCHC 32.6 30.0 - 36.0 g/dL   RDW 14.9 70.2 - 63.7 %   Platelets 276 150 - 400 K/uL   nRBC 0.0 0.0 - 0.2 %   Neutrophils Relative % 65 %   Neutro Abs 5.1 1.7 - 7.7 K/uL   Lymphocytes Relative 24 %   Lymphs Abs 1.8 0.7 - 4.0 K/uL   Monocytes Relative 6 %   Monocytes Absolute 0.5 0 - 1 K/uL   Eosinophils Relative 4 %   Eosinophils Absolute 0.3 0 - 0 K/uL   Basophils  Relative 1 %   Basophils Absolute 0.0 0 - 0 K/uL   Immature Granulocytes 0 %   Abs Immature Granulocytes 0.01 0.00 - 0.07 K/uL  Comprehensive metabolic panel     Status: Abnormal   Collection  Time: 08/05/19 10:59 PM  Result Value Ref Range   Sodium 136 135 - 145 mmol/L   Potassium 3.3 (L) 3.5 - 5.1 mmol/L   Chloride 103 98 - 111 mmol/L   CO2 25 22 - 32 mmol/L   Glucose, Bld 93 70 - 99 mg/dL   BUN 9 6 - 20 mg/dL   Creatinine, Ser 9.52 0.44 - 1.00 mg/dL   Calcium 9.1 8.9 - 84.1 mg/dL   Total Protein 7.6 6.5 - 8.1 g/dL   Albumin 3.8 3.5 - 5.0 g/dL   AST 17 15 - 41 U/L   ALT 7 0 - 44 U/L   Alkaline Phosphatase 36 (L) 38 - 126 U/L   Total Bilirubin 1.0 0.3 - 1.2 mg/dL   GFR calc non Af Amer >60 >60 mL/min   GFR calc Af Amer >60 >60 mL/min   Anion gap 8 5 - 15  I-Stat Beta hCG blood, ED ([facility], [facility], [facility] only)     Status: Abnormal   Collection Time: 08/05/19 11:13 PM  Result Value Ref Range   I-stat hCG, quantitative 802.3 (H) <5 mIU/mL   Comment 3          Urinalysis, Routine w reflex microscopic     Status: Abnormal   Collection Time: 08/05/19 11:47 PM  Result Value Ref Range   Color, Urine AMBER (A) YELLOW   APPearance CLOUDY (A) CLEAR   Specific Gravity, Urine 1.029 1.005 - 1.030   pH 6.0 5.0 - 8.0   Glucose, UA NEGATIVE NEGATIVE mg/dL   Hgb urine dipstick LARGE (A) NEGATIVE   Bilirubin Urine NEGATIVE NEGATIVE   Ketones, ur NEGATIVE NEGATIVE mg/dL   Protein, ur >=324 (A) NEGATIVE mg/dL   Nitrite NEGATIVE NEGATIVE   Leukocytes,Ua NEGATIVE NEGATIVE   RBC / HPF >50 (H) 0 - 5 RBC/hpf   WBC, UA >50 (H) 0 - 5 WBC/hpf   Bacteria, UA NONE SEEN NONE SEEN   WBC Clumps PRESENT    Budding Yeast PRESENT   hCG, quantitative, pregnancy     Status: Abnormal   Collection Time: 08/06/19  1:13 AM  Result Value Ref Range   hCG, Beta Chain, Quant, S 1,053 (H) <5 mIU/mL  Type and screen [facility]     Status: None   Collection Time: 08/06/19  1:20 AM  Result Value Ref Range    ABO/RH(D) O POS    Antibody Screen NEG    Sample Expiration      08/09/2019,2359 Performed at [facility], [address] [place], [place] [address]   CBC     Status: Abnormal   Collection Time: 08/06/19  4:00 AM  Result Value Ref Range   WBC 10.0 4.0 - 10.5 K/uL   RBC 3.35 (L) 3.87 - 5.11 MIL/uL   Hemoglobin 9.4 (L) 12.0 - 15.0 g/dL   HCT 72.5 (L) 36 - 46 %   MCV 86.9 80.0 - 100.0 fL   MCH 28.1 26.0 - 34.0 pg   MCHC 32.3 30.0 - 36.0 g/dL   RDW 36.6 44.0 - 34.7 %   Platelets 287 150 - 400 K/uL   nRBC 0.0 0.0 - 0.2 %   US OB LESS THAN 14 WEEKS WITH OB TRANSVAGINAL  Result Date: 08/06/2019 CLINICAL DATA:  Persistent vaginal bleeding x1 month with medical abortion performed on July 09, 2019. EXAM: OBSTETRIC <14 WK [place] AND TRANSVAGINAL OB US TECHNIQUE: Both transabdominal and transvaginal ultrasound examinations were performed for complete evaluation of the  gestation as well as the maternal uterus, adnexal regions, and pelvic cul-de-sac. Transvaginal technique was performed to assess early pregnancy. COMPARISON:  None. FINDINGS: Intrauterine gestational sac: None Yolk sac:  Not Visualized. Embryo:  Not Visualized. Cardiac Activity: Not Visualized. Heart Rate: N/A  bpm Subchorionic hemorrhage:  None visualized. Maternal uterus/adnexae: The endometrium is thickened (1.58 cm) and heterogeneous in appearance. Increased flow is seen within the fundal portion of the endometrial canal. The left and right ovaries are visualized and are normal in appearance. IMPRESSION: 1. Findings consistent with retained products of conception within the fundal portion of the endometrial canal. Electronically Signed   By: Aram Candela M.D.   On: 08/06/2019 02:50   Labs repeated @ 0400 to assess for blood loss anemia  CBC 9.4 Patient bleeding is minimal after vaginal cytotec ( ) Patient ate and went to restroom on own without lightheadedness or dizziness  Rx for ferrous sulfate sent to pharmacy of  choice   Reviewed with patient reasons to return to MAU and bleeding precautions. Discussed reasons to return to MAU. Follow up as scheduled in the office. Return to MAU as needed. Pt stable at time of discharge. Follow up in 1 week for repeat HCG and 2 weeks with provider   Assessment and Plan   1. Retained products of conception after induced termination of pregnancy   2. Vaginal bleeding during pregnancy   3. Anemia associated with acute blood loss    Discharge home Follow up as scheduled in the office in 1 week for HCG and 2 weeks with provider, message sent to office for scheduling  Return to MAU as needed for reasons discussed and/or emergencies  Bleeding precautions and reasons to return to MAU    Follow-up Information    [facility]. Schedule an appointment as soon as possible for a visit.   Specialty: Obstetrics and Gynecology Why: Follow up in 1 week for repeat labs and 2 weeks for follow up with provider  Contact information: [address] [place] [address] [phone]             Allergies as of 08/06/2019      Reactions   Penicillins Hives   Has patient had a PCN reaction causing immediate rash, facial/tongue/throat swelling, SOB or lightheadedness with hypotension: Yes Has patient had a PCN reaction causing severe rash involving mucus membranes or skin necrosis: Yes Has patient had a PCN reaction that required hospitalization Yes Has patient had a PCN reaction occurring within the last 10 years: No If all of the above answers are "NO", then may proceed with Cephalosporin use.      Medication List    TAKE these medications   acetaminophen 500 MG tablet Commonly known as: TYLENOL Take 500 mg by mouth every 6 (six) hours as needed.   dicyclomine 20 MG tablet Commonly known as: BENTYL Take 1 tablet (20 mg total) by mouth 2 (two) times daily as needed (abdominal cramping).   docusate sodium 100 MG  capsule Commonly known as: Colace Take 1 capsule (100 mg total) by mouth 2 (two) times daily.   ferrous sulfate 325 (65 FE) MG tablet Take 1 tablet (325 mg total) by mouth 2 (two) times daily with a meal.   ibuprofen 600 MG tablet Commonly known as: ADVIL Take 1 tablet (600 mg total) by mouth every 6 (six) hours.   ondansetron 4 MG disintegrating tablet Commonly known as: Zofran ODT Take 1 tablet (4  mg total) by mouth every 8 (eight) hours as needed for nausea or vomiting.   PNV Prenatal Plus Multivitamin 27-1 MG Tabs Take 1 tablet by mouth daily.       Sharyon CableVeronica C Joei Frangos CNM 08/06/2019, 7:32 AM

## 2019-08-06 NOTE — MAU Note (Addendum)
Patient sent from the ED for vaginal bleeding.  Took cytotec on June 12/14 then started having heavy vaginal bleeding tonight around 2130.  States she started having abdominal cramping beginning around 2200.  No pain now.    LMP 4/29.

## 2019-08-08 ENCOUNTER — Telehealth: Payer: Medicaid Other | Admitting: Internal Medicine

## 2019-08-09 LAB — SURGICAL PATHOLOGY

## 2019-08-12 ENCOUNTER — Other Ambulatory Visit: Payer: Medicaid Other

## 2019-08-23 ENCOUNTER — Ambulatory Visit: Payer: Medicaid Other

## 2020-01-31 ENCOUNTER — Other Ambulatory Visit: Payer: Medicaid Other

## 2020-01-31 DIAGNOSIS — Z20822 Contact with and (suspected) exposure to covid-19: Secondary | ICD-10-CM

## 2020-02-02 LAB — SARS-COV-2, NAA 2 DAY TAT

## 2020-02-02 LAB — NOVEL CORONAVIRUS, NAA: SARS-CoV-2, NAA: DETECTED — AB

## 2020-03-02 ENCOUNTER — Encounter (HOSPITAL_COMMUNITY): Payer: Self-pay

## 2020-03-02 ENCOUNTER — Other Ambulatory Visit: Payer: Self-pay

## 2020-03-02 ENCOUNTER — Inpatient Hospital Stay (HOSPITAL_COMMUNITY)
Admission: AD | Admit: 2020-03-02 | Discharge: 2020-03-02 | Disposition: A | Discharge status: Home / Self Care | Payer: BC Managed Care – PPO | Attending: Obstetrics and Gynecology | Admitting: Obstetrics and Gynecology

## 2020-03-02 DIAGNOSIS — D509 Iron deficiency anemia, unspecified: Secondary | ICD-10-CM | POA: Diagnosis not present

## 2020-03-02 DIAGNOSIS — Z3A16 16 weeks gestation of pregnancy: Secondary | ICD-10-CM | POA: Diagnosis not present

## 2020-03-02 DIAGNOSIS — O99012 Anemia complicating pregnancy, second trimester: Secondary | ICD-10-CM | POA: Diagnosis not present

## 2020-03-02 DIAGNOSIS — Z3492 Encounter for supervision of normal pregnancy, unspecified, second trimester: Secondary | ICD-10-CM

## 2020-03-02 DIAGNOSIS — D649 Anemia, unspecified: Secondary | ICD-10-CM

## 2020-03-02 LAB — I-STAT BETA HCG BLOOD, ED (MC, WL, AP ONLY): I-stat hCG, quantitative: >2000 m[IU]/mL — ABNORMAL HIGH (ref <5)

## 2020-03-02 LAB — I-STAT CHEM 8, ED
BUN: 4 mg/dL — ABNORMAL LOW (ref 6–20)
Calcium, Ion: 1.21 mmol/L (ref 1.15–1.40)
Chloride: 104 mmol/L (ref 98–111)
Creatinine, Ser: 0.3 mg/dL — ABNORMAL LOW (ref 0.44–1.00)
Glucose, Bld: 84 mg/dL (ref 70–99)
HCT: 24 % — ABNORMAL LOW (ref 36.0–46.0)
Hemoglobin: 8.2 g/dL — ABNORMAL LOW (ref 12.0–15.0)
Potassium: 3.4 mmol/L — ABNORMAL LOW (ref 3.5–5.1)
Sodium: 136 mmol/L (ref 135–145)
TCO2: 21 mmol/L — ABNORMAL LOW (ref 22–32)

## 2020-03-02 NOTE — MAU Note (Signed)
Pt went to have elective termination today, procedure not done secondary told she was anemic.  Clinic advised pt to go to ED for iron infusion.  Denies VB.  Unsure LMP.

## 2020-03-02 NOTE — ED Triage Notes (Signed)
Pt sent here for iron transfusion, states she was supposed to have an abortion today, is [redacted] weeks pregnant (pt does not know her LMP) but they said her iron level was extremely low so they sent her here. Pt appears pale and fatigued in triage.

## 2020-03-02 NOTE — ED Triage Notes (Signed)
Emergency Medicine Provider OB Triage Evaluation Note  Samantha Blair is a 25 y.o. female, [phone], at approximately [redacted] weeks gestation who presents to the emergency department with complaints of abdominal pain and low iron.  She [facility] but she reports that they checked her iron and said it was very low and told her to come to the hospital, they did not send her with any paperwork or lab values.  She also reports she has been having a cramping and discomfort in her abdomen that has been getting worse.  Denies any vaginal bleeding.  Reports she has been feeling lightheaded, weak and fatigued but has not had any syncopal episodes.  Review of  Systems  Positive: Abdominal pain, lightheadedness, weakness, fatigue Negative: Vaginal bleeding  Physical Exam  BP 120/76   Pulse 68   Temp 98.2 F (36.8 C) (Oral)   Resp 14   LMP  (LMP Unknown)   SpO2 100%  General: Awake, no distress  HEENT: Atraumatic  Resp: Normal effort  Cardiac: Normal rate Abd: Gravid abdomen MSK: Moves all extremities without difficulty Neuro: Speech clear  Medical Decision Making  Pt evaluated for pregnancy concern and is stable for transfer to MAU. Pt is in agreement with plan for transfer.  Labs Reviewed  I-STAT BETA HCG BLOOD, ED ([facility], [facility], [facility] ONLY) - Abnormal; Notable for the following components:      Result Value   I-stat hCG, quantitative >2,000.0 (*)    All other components within normal limits  I-STAT CHEM 8, ED - Abnormal; Notable for the following components:   Potassium 3.4 (*)    BUN 4 (*)    Creatinine, Ser 0.30 (*)    TCO2 21 (*)    Hemoglobin 8.2 (*)    HCT 24.0 (*)    All other components within normal limits    11:31 AM Discussed with MAU APP, Misty Stanley, who accepts patient in transfer.  Clinical Impression   1. Abdominal pain during pregnancy, antepartum   2. Anemia, unspecified type        Dartha Lodge,  [place] 03/02/20 1155

## 2020-03-02 NOTE — MAU Provider Note (Signed)
S Samantha Blair is a 25 y.o. [phone] pregnant female at approximately [redacted]wks gestation who presents to MAU today with complaint of iron deficiency anemia. She was scheduled for an [facility] at [facility]) today, but could not have it done because her hemoglobin was found to be 8.2 today. She was sent her for an iron infusion by her MD at [facility]. She has some mild cramping, but rates as discomfort rather than pain and says she has had it the entire pregnancy and does not want it evaluated.   O BP 109/71 (BP Location: Right Arm)   Pulse 71   Temp 98.6 F (37 C) (Oral)   Resp 20   Ht 5\' 5"  (1.651 m)   Wt 162 lb 4.8 oz (73.6 kg)   LMP  (LMP Unknown)   SpO2 100%   BMI 27.01 kg/m  Physical Exam Vitals and nursing note reviewed.  Constitutional:      General: She is not in acute distress.    Appearance: Normal appearance. She is not ill-appearing.  Eyes:     Pupils: Pupils are equal, round, and reactive to light.  Cardiovascular:     Rate and Rhythm: Normal rate and regular rhythm.  Pulmonary:     Effort: Pulmonary effort is normal.  Musculoskeletal:        General: Normal range of motion.     Cervical back: Normal range of motion.  Skin:    General: Skin is warm and dry.     Capillary Refill: Capillary refill takes less than 2 seconds.  Neurological:     Mental Status: She is alert and oriented to person, place, and time.  Psychiatric:        Mood and Affect: Mood normal.        Behavior: Behavior normal.        Thought Content: Thought content normal.        Judgment: Judgment normal.    A Iron deficiency anemia Pregnant, second trimester Medical screening exam complete  P Discharge from MAU in stable condition - Informed pt that we do not administer feraheme in the MAU for non-symptomatic anemia and apologized for the misinformation she was given - Offered to place order for feraheme infusion, pt declined and will follow up with [facility] - Advised she can take Blood  Builder supplement twice daily until her rescheduled IAB which may raise her hgb enough to have the procedure. Pt amenable to plan  Warning signs for worsening condition that would warrant emergency follow-up discussed Patient may return to MAU as needed for emergent OB/GYN related complaints  [facility], CNM 03/02/2020 1:41 PM

## 2020-10-25 ENCOUNTER — Encounter: Payer: Medicaid Other | Admitting: Obstetrics & Gynecology

## 2020-10-29 ENCOUNTER — Encounter: Payer: Medicaid Other | Admitting: Obstetrics

## 2020-11-09 ENCOUNTER — Encounter: Payer: Medicaid Other | Admitting: Obstetrics

## 2020-11-15 ENCOUNTER — Other Ambulatory Visit: Payer: Self-pay

## 2020-11-15 ENCOUNTER — Emergency Department (HOSPITAL_COMMUNITY)
Admission: EM | Admit: 2020-11-15 | Discharge: 2020-11-16 | Disposition: A | Discharge status: Other Acute Inpatient Hospital | Payer: Medicaid Other | Source: Home / Self Care | Attending: Emergency Medicine | Admitting: Emergency Medicine

## 2020-11-15 ENCOUNTER — Encounter (HOSPITAL_COMMUNITY): Payer: Self-pay | Admitting: Emergency Medicine

## 2020-11-15 DIAGNOSIS — R45851 Suicidal ideations: Secondary | ICD-10-CM | POA: Insufficient documentation

## 2020-11-15 DIAGNOSIS — O99341 Other mental disorders complicating pregnancy, first trimester: Secondary | ICD-10-CM | POA: Insufficient documentation

## 2020-11-15 DIAGNOSIS — F332 Major depressive disorder, recurrent severe without psychotic features: Secondary | ICD-10-CM | POA: Insufficient documentation

## 2020-11-15 DIAGNOSIS — Y900 Blood alcohol level of less than 20 mg/100 ml: Secondary | ICD-10-CM | POA: Insufficient documentation

## 2020-11-15 DIAGNOSIS — Z20822 Contact with and (suspected) exposure to covid-19: Secondary | ICD-10-CM | POA: Insufficient documentation

## 2020-11-15 DIAGNOSIS — Z87891 Personal history of nicotine dependence: Secondary | ICD-10-CM | POA: Insufficient documentation

## 2020-11-15 DIAGNOSIS — Z3201 Encounter for pregnancy test, result positive: Secondary | ICD-10-CM | POA: Insufficient documentation

## 2020-11-15 LAB — RAPID URINE DRUG SCREEN, HOSP PERFORMED
Amphetamines: NOT DETECTED
Barbiturates: NOT DETECTED
Benzodiazepines: NOT DETECTED
Cocaine: NOT DETECTED
Opiates: NOT DETECTED
Tetrahydrocannabinol: NOT DETECTED

## 2020-11-15 LAB — COMPREHENSIVE METABOLIC PANEL
ALT: 8 U/L (ref 0–44)
AST: 18 U/L (ref 15–41)
Albumin: 4.1 g/dL (ref 3.5–5.0)
Alkaline Phosphatase: 43 U/L (ref 38–126)
Anion gap: 8 (ref 5–15)
BUN: 8 mg/dL (ref 6–20)
CO2: 23 mmol/L (ref 22–32)
Calcium: 9.4 mg/dL (ref 8.9–10.3)
Chloride: 102 mmol/L (ref 98–111)
Creatinine, Ser: 0.76 mg/dL (ref 0.44–1.00)
GFR, Estimated: >60 mL/min (ref >60)
Glucose, Bld: 85 mg/dL (ref 70–99)
Potassium: 3.9 mmol/L (ref 3.5–5.1)
Sodium: 133 mmol/L — ABNORMAL LOW (ref 135–145)
Total Bilirubin: 2 mg/dL — ABNORMAL HIGH (ref 0.3–1.2)
Total Protein: 8.4 g/dL — ABNORMAL HIGH (ref 6.5–8.1)

## 2020-11-15 LAB — CBC WITH DIFFERENTIAL/PLATELET
Abs Immature Granulocytes: 0.03 10*3/uL (ref 0.00–0.07)
Basophils Absolute: 0.1 10*3/uL (ref 0.0–0.1)
Basophils Relative: 1 %
Eosinophils Absolute: 0.1 10*3/uL (ref 0.0–0.5)
Eosinophils Relative: 1 %
HCT: 37.7 % (ref 36.0–46.0)
Hemoglobin: 11.5 g/dL — ABNORMAL LOW (ref 12.0–15.0)
Immature Granulocytes: 0 %
Lymphocytes Relative: 18 %
Lymphs Abs: 1.8 10*3/uL (ref 0.7–4.0)
MCH: 23.7 pg — ABNORMAL LOW (ref 26.0–34.0)
MCHC: 30.5 g/dL (ref 30.0–36.0)
MCV: 77.6 fL — ABNORMAL LOW (ref 80.0–100.0)
Monocytes Absolute: 0.7 10*3/uL (ref 0.1–1.0)
Monocytes Relative: 8 %
Neutro Abs: 7.2 10*3/uL (ref 1.7–7.7)
Neutrophils Relative %: 72 %
Platelets: 325 10*3/uL (ref 150–400)
RBC: 4.86 MIL/uL (ref 3.87–5.11)
RDW: 16 % — ABNORMAL HIGH (ref 11.5–15.5)
WBC: 9.8 10*3/uL (ref 4.0–10.5)
nRBC: 0 % (ref 0.0–0.2)

## 2020-11-15 LAB — RESP PANEL BY RT-PCR (FLU A&B, COVID) ARPGX2
Influenza A by PCR: NEGATIVE
Influenza B by PCR: NEGATIVE
SARS Coronavirus 2 by RT PCR: NEGATIVE

## 2020-11-15 LAB — I-STAT BETA HCG BLOOD, ED (MC, WL, AP ONLY): I-stat hCG, quantitative: 122.3 m[IU]/mL — ABNORMAL HIGH (ref <5)

## 2020-11-15 LAB — HCG, QUANTITATIVE, PREGNANCY: hCG, Beta Chain, Quant, S: 165 m[IU]/mL — ABNORMAL HIGH (ref <5)

## 2020-11-15 LAB — ETHANOL: Alcohol, Ethyl (B): <10 mg/dL (ref <10)

## 2020-11-15 MED ORDER — ACETAMINOPHEN 325 MG PO TABS: 650.0000 mg | ORAL_TABLET | ORAL | Status: DC | PRN

## 2020-11-15 NOTE — ED Notes (Signed)
TTS complete 

## 2020-11-15 NOTE — ED Provider Notes (Signed)
Emergency Medicine Provider Triage Evaluation Note  Samantha Blair , a 25 y.o. female  was evaluated in triage.  Pt complains of suicidal ideations.  This is been a struggle for some years, it was recently exacerbated that 2 days ago her boyfriend committed suicide.  Patient has not had prior suicide attempts, but she does have plan to either walk in front of a semitruck or use a gun or overdose on pills.  Patient has children at home which are alleviating factors.    Review of Systems  Positive: SI Negative: She denies any homicidal ideations, not seeing any voices or hallucinations..  Physical Exam  LMP  (LMP Unknown)  Gen:   Awake, tearful Resp:  Normal effort  MSK:   Moves extremities without difficulty  Other:    Medical Decision Making  Medically screening exam initiated at 6:14 PM.  Appropriate orders placed.  Arbell Hibberd was informed that the remainder of the evaluation will be completed by another provider, this initial triage assessment does not replace that evaluation, and the importance of remaining in the ED until their evaluation is complete.  Medically clear, room and site   Theron Arista, [place] 11/15/20 1814    Rozelle Logan, DO 11/15/20 2355

## 2020-11-15 NOTE — BH Assessment (Signed)
Clinician messaged Wendy Poet, RN "Hey it's Thurston Pounds is the pt able to be assessed? if so I can see her if placed in a private room. Also is she under IVC?"    Clinician awaiting response.    Redmond Pulling, MS, [facility], [facility] Triage Specialist [phone]

## 2020-11-15 NOTE — ED Notes (Signed)
TTS at the bedside. 

## 2020-11-15 NOTE — ED Provider Notes (Addendum)
[facility] EMERGENCY DEPARTMENT Provider Note   CSN: [id number] Arrival date & time: 11/15/20  1757     History Chief Complaint  Patient presents with   Suicidal    Samantha Blair is a 25 y.o. female.  Presents to ER with concern for suicidal ideation.  Just a couple days ago patient's boyfriend committed suicide.  Patient has struggled with depression herself previously, is not currently on any medication.  She has had suicidal thoughts herself.  Has not taken any steps to hurt herself.  No thoughts of hurting others.  HPI     Past Medical History:  Diagnosis Date   Abortion    Goiter    left side of neck    Patient Active Problem List   Diagnosis Date Noted   VBAC (vaginal birth after Cesarean) 12/09/2015   Normal labor 12/08/2015   Thyroid enlarged 10/25/2015    Past Surgical History:  Procedure Laterality Date   CESAREAN SECTION       OB History     Gravida  4   Para  2   Term  1   Preterm  1   AB  1   Living  3      SAB      IAB  1   Ectopic      Multiple  1   Live Births  3           Family History  Problem Relation Age of Onset   Cancer Maternal Grandmother    Cancer Maternal Grandfather     Social History   Tobacco Use   Smoking status: Former   Smokeless tobacco: Never  Substance Use Topics   Alcohol use: No   Drug use: No    Home Medications Prior to Admission medications   Medication Sig Start Date End Date Taking? Authorizing Provider  dicyclomine (BENTYL) 20 MG tablet Take 1 tablet (20 mg total) by mouth 2 (two) times daily as needed (abdominal cramping). Patient not taking: Reported on 11/15/2020 03/20/18   Ward, Chase Picket, PA-C  docusate sodium (COLACE) 100 MG capsule Take 1 capsule (100 mg total) by mouth 2 (two) times daily. Patient not taking: Reported on 11/15/2020 12/09/15   Michaele Offer, DO  ferrous sulfate 325 (65 FE) MG tablet Take 1 tablet (325 mg total) by mouth 2  (two) times daily with a meal. Patient not taking: Reported on 11/15/2020 08/06/19   Sharyon Cable, CNM  ibuprofen (ADVIL,MOTRIN) 600 MG tablet Take 1 tablet (600 mg total) by mouth every 6 (six) hours. Patient not taking: Reported on 11/15/2020 12/09/15   Michaele Offer, DO  ondansetron (ZOFRAN ODT) 4 MG disintegrating tablet Take 1 tablet (4 mg total) by mouth every 8 (eight) hours as needed for nausea or vomiting. Patient not taking: Reported on 11/15/2020 03/20/18   Ward, Chase Picket, PA-C  Prenatal Vit-Fe Fumarate-FA (PNV PRENATAL PLUS MULTIVITAMIN) 27-1 MG TABS Take 1 tablet by mouth daily.  Patient not taking: Reported on 11/15/2020 09/27/15   [provider]    Allergies    Penicillins  Review of Systems   Review of Systems  Constitutional:  Negative for chills and fever.  HENT:  Negative for ear pain and sore throat.   Eyes:  Negative for pain and visual disturbance.  Respiratory:  Negative for cough and shortness of breath.   Cardiovascular:  Negative for chest pain and palpitations.  Gastrointestinal:  Negative for abdominal pain and vomiting.  Genitourinary:  Negative for dysuria and hematuria.  Musculoskeletal:  Negative for arthralgias and back pain.  Skin:  Negative for color change and rash.  Neurological:  Negative for seizures and syncope.  Psychiatric/Behavioral:  Positive for dysphoric mood. The patient is nervous/anxious.   All other systems reviewed and are negative.  Physical Exam Updated Vital Signs BP 127/77 (BP Location: Right Arm)   Pulse 95   Temp 99.5 F (37.5 C) (Oral)   Resp (!) 22   LMP  (LMP Unknown)   SpO2 100%   Breastfeeding Unknown   Physical Exam Vitals and nursing note reviewed.  Constitutional:      General: She is not in acute distress.    Appearance: She is well-developed.  HENT:     Head: Normocephalic and atraumatic.  Eyes:     Conjunctiva/sclera: Conjunctivae normal.  Cardiovascular:     Rate and  Rhythm: Normal rate and regular rhythm.     Heart sounds: No murmur heard. Pulmonary:     Effort: Pulmonary effort is normal. No respiratory distress.     Breath sounds: Normal breath sounds.  Abdominal:     Palpations: Abdomen is soft.     Tenderness: There is no abdominal tenderness.  Musculoskeletal:     Cervical back: Neck supple.  Skin:    General: Skin is warm and dry.  Neurological:     Mental Status: She is alert.  Psychiatric:     Comments: Tearful, calm    ED Results / Procedures / Treatments   Labs (all labs ordered are listed, but only abnormal results are displayed) Labs Reviewed  COMPREHENSIVE METABOLIC PANEL - Abnormal; Notable for the following components:      Result Value   Sodium 133 (*)    Total Protein 8.4 (*)    Total Bilirubin 2.0 (*)    All other components within normal limits  CBC WITH DIFFERENTIAL/PLATELET - Abnormal; Notable for the following components:   Hemoglobin 11.5 (*)    MCV 77.6 (*)    MCH 23.7 (*)    RDW 16.0 (*)    All other components within normal limits  HCG, QUANTITATIVE, PREGNANCY - Abnormal; Notable for the following components:   hCG, Beta Chain, Quant, S 165 (*)    All other components within normal limits  I-STAT BETA HCG BLOOD, ED ([facility], [facility], [facility] ONLY) - Abnormal; Notable for the following components:   I-stat hCG, quantitative 122.3 (*)    All other components within normal limits  RESP PANEL BY RT-PCR (FLU A&B, COVID) [id number]  ETHANOL  RAPID URINE DRUG SCREEN, HOSP PERFORMED    EKG None  Radiology No results found.  Procedures Procedures   Medications Ordered in ED Medications - No data to display  ED Course  I have reviewed the triage vital signs and the nursing notes.  Pertinent labs & imaging results that were available during my care of the patient were reviewed by me and considered in my medical decision making (see chart for details).    MDM Rules/Calculators/A&P                            25 year old lady presenting to the ER with concern for suicidal ideation.  Boyfriend recently committed suicide.  Patient denies any medical complaints.  Vital signs are stable.  Basic labs were obtained and notable for positive hCG.  Confirmed with upstairs lab hCG test.  Patient not aware of this finding.  Suspect very early pregnancy.  Will need routine OB follow-up for this finding.  For her depression and suicidal thoughts, consult to TTS.  Patient is medically cleared for further psychiatric evaluation.  Currently voluntary and interested in receiving help.   Final Clinical Impression(s) / ED Diagnoses Final diagnoses:  Positive pregnancy test  Suicidal ideation    Rx / DC Orders ED Discharge Orders     None        Milagros Loll, MD 11/15/20 2132    Milagros Loll, MD 11/15/20 2134

## 2020-11-15 NOTE — Progress Notes (Signed)
   11/15/20 2207  Clinical Encounter Type  Visited With Patient and family together  Visit Type Spiritual support;Psychological support  Referral From Physician  Consult/Referral To Chaplain   The Attending MD requested that this Chaplain visit the patient. MD explained the recent incidents the patient had experienced. The patient was sitting up, and her mother was at her bedside. Chaplain was able to establish a rapport with the patient and her mother. The Chaplain actively listened as the patient became tearful when she said the urine test showed that she was pregnant. The patient said she wants the doctor to request blood because she had her period last month. The patient wanted to sit in the chair. She covered her face with her hands and began to cry. Chaplain encouraged sharing of feelings. Chaplain also offered emotional support and spiritual care through prayer and exploring hope. This note was prepared by Deneen Harts, M.Div..  For questions please contact by phone [phone].

## 2020-11-15 NOTE — ED Triage Notes (Signed)
Pt reports suicidal ideation with a plan. Significant other committed suicide earlier in the week. Pt tearful.

## 2020-11-15 NOTE — ED Notes (Signed)
Pt hysterically crying, unable to obtain EKG or Covid swab at this time. RN aware, MD at bedside.

## 2020-11-16 ENCOUNTER — Other Ambulatory Visit: Payer: Self-pay | Admitting: Psychiatry

## 2020-11-16 ENCOUNTER — Inpatient Hospital Stay (HOSPITAL_COMMUNITY)
Admission: AD | Admit: 2020-11-16 | Discharge: 2020-11-19 | DRG: 885 | Disposition: A | Discharge status: Home / Self Care | Payer: Medicaid Other | Source: Intra-hospital | Attending: Emergency Medicine | Admitting: Emergency Medicine

## 2020-11-16 ENCOUNTER — Encounter (HOSPITAL_COMMUNITY): Payer: Self-pay | Admitting: Family

## 2020-11-16 DIAGNOSIS — R45851 Suicidal ideations: Secondary | ICD-10-CM | POA: Diagnosis present

## 2020-11-16 DIAGNOSIS — G4700 Insomnia, unspecified: Secondary | ICD-10-CM | POA: Diagnosis present

## 2020-11-16 DIAGNOSIS — Z20822 Contact with and (suspected) exposure to covid-19: Secondary | ICD-10-CM | POA: Diagnosis present

## 2020-11-16 DIAGNOSIS — F329 Major depressive disorder, single episode, unspecified: Secondary | ICD-10-CM | POA: Diagnosis present

## 2020-11-16 DIAGNOSIS — Z87891 Personal history of nicotine dependence: Secondary | ICD-10-CM

## 2020-11-16 DIAGNOSIS — F332 Major depressive disorder, recurrent severe without psychotic features: Principal | ICD-10-CM | POA: Diagnosis present

## 2020-11-16 DIAGNOSIS — E559 Vitamin D deficiency, unspecified: Secondary | ICD-10-CM | POA: Diagnosis present

## 2020-11-16 DIAGNOSIS — Z880 Allergy status to penicillin: Secondary | ICD-10-CM | POA: Diagnosis not present

## 2020-11-16 MED ORDER — ACETAMINOPHEN 325 MG PO TABS: 650.0000 mg | ORAL_TABLET | Freq: Four times a day (QID) | ORAL | Status: DC | PRN

## 2020-11-16 NOTE — ED Notes (Signed)
Lunch ordered 

## 2020-11-16 NOTE — ED Notes (Signed)
Mother took pt belongings home

## 2020-11-16 NOTE — Progress Notes (Addendum)
Pt accepted to [facility] 407-1   Patient meets inpatient criteria per Nira Conn NP  The attending provider will be Haze Rushing, MD   Call report to [phone]    Edwina Barth, RN @ [facility] ED notified.     Pt scheduled  to arrive at [facility] TODAY. The Pt.'s bed will be available at 1330.    Damita Dunnings, MSW, LCSW-A  1:27 PM 11/16/2020

## 2020-11-16 NOTE — ED Notes (Signed)
Pt ambulated to restroom with steady gait without incident

## 2020-11-16 NOTE — Tx Team (Signed)
Initial Treatment Plan 11/16/2020 6:29 PM Yaffa Seckman [id number]    PATIENT STRESSORS: Financial difficulties   Loss of significant other   Traumatic event     PATIENT STRENGTHS: Average or above average intelligence  Capable of independent living  Motivation for treatment/growth  Supportive family/friends    PATIENT IDENTIFIED PROBLEMS: "My depression"  "Anxiety"  Suicidal ideation  Depression  Ineffective coping skills             DISCHARGE CRITERIA:  Ability to meet basic life and health needs Adequate post-discharge living arrangements Motivation to continue treatment in a less acute level of care  PRELIMINARY DISCHARGE PLAN: Attend aftercare/continuing care group Outpatient therapy Return to previous living arrangement  PATIENT/FAMILY INVOLVEMENT: This treatment plan has been presented to and reviewed with the patient, Anetria Harwick, and/or family member.  The patient and family have been given the opportunity to ask questions and make suggestions.  Clarene Critchley, RN 11/16/2020, 6:29 PM

## 2020-11-16 NOTE — [facility] Group Notes (Signed)
Pt did not attend Psychoeducational group. 

## 2020-11-16 NOTE — ED Notes (Signed)
Fruit cup ordered per RN request

## 2020-11-16 NOTE — Progress Notes (Signed)
Pt visible on the unit some, pt visibly sad.     11/16/20 2200  Psych Admission Type (Psych Patients Only)  Admission Status Voluntary  Psychosocial Assessment  Patient Complaints Depression  Eye Contact Brief  Facial Expression Flat  Affect Depressed  Speech Logical/coherent  Interaction Assertive  Motor Activity Slow  Appearance/Hygiene In scrubs  Behavior Characteristics Cooperative  Mood Depressed  Thought Process  Coherency WDL  Content WDL  Delusions None reported or observed  Perception WDL  Hallucination None reported or observed  Judgment Poor  Confusion WDL  Danger to Self  Current suicidal ideation? Denies  Danger to Others  Danger to Others None reported or observed

## 2020-11-16 NOTE — ED Notes (Signed)
Pt admitted to [facility]. Pt stable upon discharged and transported via Safe transport. Belongings given to safe transport.

## 2020-11-16 NOTE — Progress Notes (Signed)
Adult Psychoeducational Group Note  Date:  11/16/2020 Time:  9:43 PM  Group Topic/Focus:  Wrap-Up Group:   The focus of this group is to help patients review their daily goal of treatment and discuss progress on daily workbooks.  Participation Level:  Active  Participation Quality:  Appropriate  Affect:  Anxious  Cognitive:  Disorganized and Confused  Insight: Limited  Engagement in Group:  Limited  Modes of Intervention:  Discussion  Additional Comments:  Pt stated her goal for today was to focus on her treatment plan. Pt stated she accomplished her goal today. Pt stated she did not  talk with her doctor or her social worker about her care today. Pt rated her overall day a 5 out of 10. Pt stated she was able to contact her mother and her mother coming for visitation tonight improved her overall day. Pt stated she felt better about herself tonight. Pt stated she was able to attend all groups held today. Pt stated she was able to attend all meals. Pt stated she took all medications provided today. Pt stated her appetite was poor today. Pt rated her sleep last night was fair. Pt stated the goal tonight was to get some rest. Pt stated she had no physical pain tonight. Pt deny visual hallucinations and auditory issues tonight.  Pt denies thoughts of harming herself or others. Pt stated she would alert staff if anything changed.  Felipa Furnace 11/16/2020, 9:43 PM

## 2020-11-16 NOTE — Progress Notes (Signed)
Mom called and asked to speak to pt's nurse.  This nurse spoke to pt's mom, Rosalyn.  Mom had several questions about discharge and mom expressed concerned over how mom would be able to take care of pt's children while pt was in the hospital.  RN explained request for discharge process to mom.  Pt said she talked to her mom and pt would like to sign 72 hours request for discharge form.  RN explained what the form means and pt verbalized understanding.  Pt signed request for discharge form on 11/16/20 at 1800.

## 2020-11-16 NOTE — BH Assessment (Signed)
Comprehensive Clinical Assessment (CCA) Note  11/16/2020 Samantha Blair [id number]  Disposition:  Nira Conn, Np recommends IP treatment. Per Rosey Bath, RN no adult beds available, disposition CSW to seek placement. Notified Jess Barters, RN of disposition and she reports she will notify ED Physician.   The patient demonstrates the following risk factors for suicide: Chronic risk factors for suicide include: previous self-harm , patient a previous of cutting once in 2012 . Acute risk factors for suicide include: loss (financial, interpersonal, professional). Protective factors for this patient include: hope for the future. Considering these factors, the overall suicide risk at this point appears to be high. Patient is not appropriate for outpatient follow up.   Flowsheet Row ED from 11/15/2020 in [facility] EMERGENCY DEPARTMENT  C-SSRS RISK CATEGORY Low Risk       Samantha Blair is a 25 year old female reporting to the Emergency Department voluntary due to Suicidal thoughts. Samantha Blair denied plans/intent but per the ED Note Samantha Blair reports a plan to either walk in front of a semitruck or use a gun or overdose on pills. Patient reports that her boyfriend of 3 months committed suicide on Tuesday. Patient reports ongoing SI for the pas year, with thoughts increasing over the past week. Patient reports a history of Outpatient Therapy back in 2014. Patient reports a history of self harm back in 2012 (cutting). Patient denies a history of HI. Patient denies AVH. Patient denied access to lethal means. Patient denies legal issues.    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     CCA Screening, Triage and Referral (STR)  Patient Reported Information How did you hear about [place]? Family/Friend  What Is the Reason for Your Visit/Call Today? Per Ed Note  "Samantha Blair a 25 y.o. female  was evaluated in triage.  Pt complains of suicidal ideations.  This is been a struggle for some years, it was recently exacerbated that 2 days ago her boyfriend committed suicide.  Patient has not had prior suicide attempts, but she does have plan to either walk in front of a semitruck or use a gun or overdose on pills.  Patient has children at home which are alleviating factors".  How Long Has This Been Causing You Problems? > than 6 months  What Do You Feel Would Help You the Most Today? Treatment for Depression or other mood problem   Have You Recently Had Any Thoughts About Hurting Yourself? No  Are You Planning to Commit Suicide/Harm Yourself At This time? Yes (Pt denies plan per ED Note pt reports have multiple plans.)   Have you Recently Had Thoughts About Hurting Someone Karolee Ohs? No  Are You Planning to Harm Someone at This Time? No  Explanation: No data recorded  Have You Used Any Alcohol or Drugs in the Past 24 Hours? No  How Long Ago Did You Use Drugs or Alcohol? No data recorded What Did You Use and How Much? No data recorded  Do You Currently Have a Therapist/Psychiatrist? No  Name of Therapist/Psychiatrist: No data recorded  Have You Been Recently Discharged From Any Office Practice or Programs? No  Explanation of Discharge From Practice/Program: No data recorded    CCA Screening Triage Referral Assessment Type of Contact: Tele-Assessment  Telemedicine Service Delivery: Telemedicine service delivery: This service was provided via telemedicine using a 2-way, interactive audio and video technology  Is this Initial  or Reassessment? Initial Assessment  Date Telepsych consult ordered in [facility]:  11/15/20  Time Telepsych consult ordered in [facility]:  1939  Location of Assessment: [facility] ED  Provider Location: [facility]   Collateral Involvement: -- (pt denied collateral contact)   Does Patient Have a Court Appointed Legal  Guardian? No data recorded Name and Contact of Legal Guardian: No data recorded If Minor and Not Living with Parent(s), Who has Custody? No data recorded Is CPS involved or ever been involved? Never  Is APS involved or ever been involved? Never   Patient Determined To Be At Risk for Harm To Self or Others Based on Review of Patient Reported Information or Presenting Complaint? No  Method: No data recorded Availability of Means: No data recorded Intent: No data recorded Notification Required: No data recorded Additional Information for Danger to Others Potential: No data recorded Additional Comments for Danger to Others Potential: No data recorded Are There Guns or Other Weapons in Your Home? No data recorded Types of Guns/Weapons: No data recorded Are These Weapons Safely Secured?                            No data recorded Who Could Verify You Are Able To Have These Secured: No data recorded Do You Have any Outstanding Charges, Pending Court Dates, Parole/Probation? No data recorded Contacted To Inform of Risk of Harm To Self or Others: -- (na)    Does Patient Present under Involuntary Commitment? No  IVC Papers Initial File Date: No data recorded  [place] of Residence: [place]   Patient Currently Receiving the Following Services: Not Receiving Services   Determination of Need: Emergent (2 hours)   Options For Referral: Medication Management; Inpatient Hospitalization; Outpatient Therapy     CCA Biopsychosocial Patient Reported Schizophrenia/Schizoaffective Diagnosis in Past: No   Strengths: -- (Patient reports her kids.)   Mental Health Symptoms Depression:   Hopelessness; Irritability; Tearfulness; Worthlessness   Duration of Depressive symptoms:  Duration of Depressive Symptoms: Greater than two weeks   Mania:   None   Anxiety:    None   Psychosis:   None   Duration of Psychotic symptoms:    Trauma:   -- (pt reports her boyfriend commited  suicide on tuesday.)   Obsessions:   None   Compulsions:   None   Inattention:   None   Hyperactivity/Impulsivity:   None   Oppositional/Defiant Behaviors:   None   Emotional Irregularity:   None   Other Mood/Personality Symptoms:   n/a    Mental Status Exam Appearance and self-care  Stature:   Average   Weight:   Average weight   Clothing:   -- (In hospital scrubs)   Grooming:   Normal   Cosmetic use:   Age appropriate   Posture/gait:   Normal   Motor activity:   Slowed   Sensorium  Attention:   Normal   Concentration:   Normal   Orientation:   X5   Recall/memory:   Normal   Affect and Mood  Affect:   Depressed; Tearful   Mood:   Depressed; Hopeless   Relating  Eye contact:   Normal   Facial expression:   Depressed; Sad   Attitude toward examiner:   Cooperative   Thought and Language  Speech flow:  Soft   Thought content:   Appropriate to Mood and Circumstances   Preoccupation:   None   Hallucinations:  None   Organization:  No data recorded  [employer] of Knowledge:   Fair   Intelligence:   Average   Abstraction:   Normal   Judgement:   Poor   Reality Testing:   Adequate   Insight:   Fair   Decision Making:   Normal   Social Functioning  Social Maturity:   Responsible   Social Judgement:   Normal   Stress  Stressors:   -- (Patient denies)   Coping Ability:  No data recorded  Skill Deficits:   None   Supports:   Family     Religion: Religion/Spirituality Are You A Religious Person?: No How Might This Affect Treatment?: na (na)  Leisure/Recreation: Leisure / Recreation Do You Have Hobbies?: No  Exercise/Diet: Exercise/Diet Do You Exercise?: No Have You Gained or Lost A Significant Amount of Weight in the Past Six Months?: No Do You Follow a Special Diet?: No Do You Have Any Trouble Sleeping?: No   CCA Employment/Education Employment/Work  Situation: Employment / Work Situation Employment Situation: Unemployed Patient's Job has Been Impacted by Current Illness:  (na) Has Patient ever Been in the [employer]?: No  Education: Education Is Patient Currently Attending School?: No Last Grade Completed:  (UTA) Did You Attend College?: No Did You Have An Individualized Education Program (IIEP): No Did You Have Any Difficulty At School?: No Patient's Education Has Been Impacted by Current Illness: No   CCA Family/Childhood History Family and Relationship History: Family history Does patient have children?: Yes How many children?: 3 How is patient's relationship with their children?: Patient reports having a good relationship with her children.  Childhood History:  Childhood History By whom was/is the patient raised?: Mother Did patient suffer any verbal/emotional/physical/sexual abuse as a child?: No Did patient suffer from severe childhood neglect?: No Has patient ever been sexually abused/assaulted/raped as an adolescent or adult?: No Was the patient ever a victim of a crime or a disaster?: No Witnessed domestic violence?: No Has patient been affected by domestic violence as an adult?: No  Child/Adolescent Assessment:     CCA Substance Use Alcohol/Drug Use: Alcohol / Drug Use Pain Medications: See MAR Prescriptions: see MAR Over the Counter: see MAR History of alcohol / drug use?: Yes Longest period of sobriety (when/how long): UTA Negative Consequences of Use:  (na) Withdrawal Symptoms: None Substance #1 Name of Substance 1: Alcohol 1 - Age of First Use: UTA 1 - Amount (size/oz): 6 shots 1 - Frequency: 1-2x a month 1 - Duration: UTA 1 - Last Use / Amount: Last week. 1 - Method of Aquiring: UTA 1- Route of Use: ORAL                       ASAM's:  Six Dimensions of Multidimensional Assessment  Dimension 1:  Acute Intoxication and/or Withdrawal Potential:   Dimension 1:  Description of  individual's past and current experiences of substance use and withdrawal: Patient drinks minimally socially at times.  Dimension 2:  Biomedical Conditions and Complications:      Dimension 3:  Emotional, Behavioral, or Cognitive Conditions and Complications:     Dimension 4:  Readiness to Change:     Dimension 5:  Relapse, Continued use, or Continued Problem Potential:     Dimension 6:  Recovery/Living Environment:     ASAM Severity Score: ASAM's Severity Rating Score: 0  ASAM Recommended Level of Treatment:     Substance use Disorder (SUD)  Recommendations for Services/Supports/Treatments: Recommendations for Services/Supports/Treatments Recommendations For Services/Supports/Treatments: Inpatient Hospitalization  Discharge Disposition: Discharge Disposition Disposition of Patient: Admit Mode of transportation if patient is discharged/movement?: N/A  DSM5 Diagnoses: Patient Active Problem List   Diagnosis Date Noted   VBAC (vaginal birth after Cesarean) 12/09/2015   Normal labor 12/08/2015   Thyroid enlarged 10/25/2015     Referrals to Alternative Service(s): Referred to Alternative Service(s):   Place:   Date:   Time:    Referred to Alternative Service(s):   Place:   Date:   Time:    Referred to Alternative Service(s):   Place:   Date:   Time:    Referred to Alternative Service(s):   Place:   Date:   Time:     Dejanique Ruehl M. Cristin Penaflor, Darcey Nora

## 2020-11-16 NOTE — Progress Notes (Signed)
ADMISSION NOTE:   Pt is a 25 y.o. female who was voluntarily committed to [facility]  Pt reports presenting to the ED d/t suicidal ideations w/ plan to either walk in front of a semitruck or use a gun or overdose on pills. Pt. Attributes recent SI to  loss of boyfriend who committed suicide 2 days ago. Patient has not had prior suicide attempts. Patient has children at home which are alleviating factors. Pt is alert and oriented x 4.  Pt present with sad affect and  depressed mood. Pt denies SI, HI and/or AVH.  Pt denied using illicit drugs and cigarette use. Pt stated that her goals are to "find a way cope about everything."    RN and pt discussed pt's admission plan of care and consents signed.  Pt verbalized understanding of plan of care.  RN oriented pt to the staff, unit, and room.  Skin assessment was completed by  Caren MHT. Piercing noted on both nipples. Pt has belongings secured in locker. Routine safety checks initiated. Pt is safe on the unit.

## 2020-11-16 NOTE — ED Provider Notes (Signed)
Emergency Medicine Observation Re-evaluation Note  Titianna Loomis is a 25 y.o. female, seen on rounds today.  Pt initially presented to the ED for complaints of Suicidal Currently, the patient is depressed and flat affect.  Physical Exam  BP 117/78 (BP Location: Left Arm)   Pulse 72   Temp 98.7 F (37.1 C) (Oral)   Resp 14   LMP  (LMP Unknown)   SpO2 100%   Breastfeeding Unknown  Physical Exam General: wdwn nad Cardiac: rrr Lungs: cta Psych: depressed  ED Course / MDM  EKG:EKG Interpretation  Date/Time:  Friday November 16 2020 07:26:40 EDT Ventricular Rate:  67 PR Interval:  132 QRS Duration: 92 QT Interval:  418 QTC Calculation: 441 R Axis:   64 Text Interpretation: Unusual P axis, possible ectopic atrial rhythm with undetermined rhythm irregularity Nonspecific T wave abnormality Abnormal ECG Confirmed by Margarita Grizzle [phone]) on 11/16/2020 2:47:16 PM  I have reviewed the labs performed to date as well as medications administered while in observation.  Recent changes in the last 24 hours include positive pregnancy test.  Plan  Current plan is for [facility] to [facility].  William Hamburger is not under involuntary commitment.     Margarita Grizzle, MD 11/16/20 1450

## 2020-11-17 DIAGNOSIS — F332 Major depressive disorder, recurrent severe without psychotic features: Principal | ICD-10-CM

## 2020-11-17 LAB — TSH: TSH: 0.912 u[IU]/mL (ref 0.350–4.500)

## 2020-11-17 MED ORDER — DIPHENHYDRAMINE HCL 25 MG PO CAPS
25.0000 mg | ORAL_CAPSULE | Freq: Once | ORAL | Status: AC | PRN
  Administered 2020-11-17: 25 mg via ORAL
  Filled 2020-11-17: qty 1

## 2020-11-17 NOTE — [facility] Group Notes (Addendum)
.  Psychoeducational Group Note    Date:11/17/2020 Time: 1300-1400    Purpose of Group: . The group focus' on teaching patients on how to identify their needs and how Life Skills:  A group where two lists are made. What people need and what are things that we do that are healthy. The lists are developed by the patients and it is explained that we often do the actions that are not healthy to get our list of needs met.  to develop the coping skills needed to get their needs met  Participation Level:  Active  Participation Quality:  Appropriate  Affect:  Appropriate  Cognitive:  Oriented  Insight:  Improving  Engagement in Group:  Engaged  Additional Comments:  Rates her energy at a 6/10. Pt broke down in tears during group when asked to write down 30 positives about herself stating "I am not sure who I am". Pt could not come up with one positive about herself  Paulino Rily

## 2020-11-17 NOTE — Progress Notes (Signed)
Pt did not attend Orientation group. 

## 2020-11-17 NOTE — Progress Notes (Signed)
Adult Psychoeducational Group Note  Date:  11/17/2020 Time:  9:38 PM  Group Topic/Focus:  Wrap-Up Group:   The focus of this group is to help patients review their daily goal of treatment and discuss progress on daily workbooks.  Participation Level:  Active  Participation Quality:  Appropriate  Affect:  Appropriate  Cognitive:  Appropriate  Insight: Appropriate  Engagement in Group:  Improving  Modes of Intervention:  Discussion  Additional Comments: Pt stated her goal for today was to focus on her treatment plan. Pt stated she accomplished her goal today. Pt stated she talk with her doctor but did not get a chance to talk with her social worker about her care today. Pt rated her overall day a 7 out of 10. Pt stated she was able to contact her mother and her friend coming for visitation tonight improved her overall day. Pt stated she felt better about herself tonight. Pt stated she was able to attend all groups held today. Pt stated she was able to attend all meals. Pt stated she took all medications provided today. Pt stated her appetite was fair today. Pt rated her sleep last night was poor. Pt stated the goal tonight was to get some rest. Pt stated she had no physical pain tonight. Pt deny visual hallucinations and auditory issues tonight.  Pt denies thoughts of harming herself or others. Pt stated she would alert staff if anything changed.   Felipa Furnace 11/17/2020, 9:38 PM

## 2020-11-17 NOTE — [facility] Suicide Risk Assessment (Signed)
[facility] Admission Suicide Risk Assessment   Nursing information obtained from:  Patient Demographic factors:  Adolescent or young adult Current Mental Status:  Self-harm thoughts Loss Factors:  Loss of significant relationship Historical Factors:  Prior suicide attempts Risk Reduction Factors:  Responsible for children under 27 years of age  Total Time spent with patient: 30 minutes Principal Problem: MDD (major depressive disorder), recurrent episode, severe (HCC) Diagnosis:  Principal Problem:   MDD (major depressive disorder), recurrent episode, severe (HCC) Active Problems:   MDD (major depressive disorder)  Subjective Data: Samantha Blair is a 25 YO AAF [redacted] weeks pregnant with a history of depression who presented with suicidal ideation, whose boyfriend killed himself on Tuesday. She explains that she was feeling that she wanted to kill herself at that time, but not now, as she does not want to leave her children alone, and now knows that she is pregnant. She is not sure what she is going to do about the pregnancy. She has been treated with therapy in the past but not medications. She is not sleeping very well, but feels this may be due to usually working nights. She is not eating very well. No hallucinations, paranoia or delusions. She is tearful. Some guilt. No history of mania.   Continued Clinical Symptoms:  Alcohol Use Disorder Identification Test Final Score (AUDIT): 0 The "Alcohol Use Disorders Identification Test", Guidelines for Use in Primary Care, Second Edition.  World Science writer [facility]). Score between 0-7:  no or low risk or alcohol related problems. Score between 8-15:  moderate risk of alcohol related problems. Score between 16-19:  high risk of alcohol related problems. Score 20 or above:  warrants further diagnostic evaluation for alcohol dependence and treatment.   CLINICAL FACTORS:   Depression:   Insomnia   Musculoskeletal: Strength & Muscle Tone: within normal  limits Gait & Station: normal Patient leans: N/A  Psychiatric Specialty Exam:  Presentation  General Appearance: Appropriate for Environment; Casual; Neat  Eye Contact:Fair  Speech:Clear and Coherent; Slow  Speech Volume:Decreased  Handedness:Right   Mood and Affect  Mood:Anxious; Depressed  Affect:Congruent; Depressed; Tearful   Thought Process  Thought Processes:Coherent; Linear; Goal Directed  Descriptions of Associations:Intact  Orientation:Full (Time, Place and Person)  Thought Content:Logical  History of Schizophrenia/Schizoaffective disorder:No  Duration of Psychotic Symptoms:No data recorded Hallucinations:Hallucinations: None  Ideas of Reference:None  Suicidal Thoughts:Suicidal Thoughts: No  Homicidal Thoughts:Homicidal Thoughts: No   Sensorium  Memory:Immediate Good; Recent Good; Remote Good  Judgment:Fair  Insight:Fair   Executive Functions  Concentration:Good  Attention Span:Good  Recall:Good  Fund of Knowledge:Good  Language:Good   Psychomotor Activity  Psychomotor Activity:Psychomotor Activity: Normal   Assets  Assets:Communication Skills; Desire for Improvement; Financial Resources/Insurance; Housing; Talents/Skills; Physical Health; Transportation   Sleep  Sleep:Sleep: Poor Number of Hours of Sleep: 0.25    Physical Exam: Physical Exam Vitals and nursing note reviewed.  HENT:     Head: Normocephalic and atraumatic.     Nose: Nose normal.  Eyes:     Extraocular Movements: Extraocular movements intact.  Pulmonary:     Effort: Pulmonary effort is normal.  Musculoskeletal:        General: Normal range of motion.     Cervical back: Normal range of motion.  Neurological:     General: No focal deficit present.     Mental Status: She is alert.  Psychiatric:        Attention and Perception: Attention normal.        Mood and Affect:  Mood is depressed. Affect is tearful.        Speech: Speech normal.         Behavior: Behavior normal. Behavior is cooperative.        Thought Content: Thought content is not paranoid or delusional. Thought content does not include homicidal or suicidal ideation.        Cognition and Memory: Cognition normal.        Judgment: Judgment normal.   Review of Systems  Constitutional:  Negative for fever.  HENT:  Negative for hearing loss.   Eyes:  Negative for blurred vision.  Respiratory:  Negative for cough.   Cardiovascular:  Negative for chest pain.  Gastrointestinal:  Negative for constipation, diarrhea, nausea and vomiting.  Genitourinary:  Negative for dysuria.       Pregnant x 2 weeks  Musculoskeletal:  Negative for back pain.  Skin:  Negative for rash.  Neurological:  Negative for headaches.  Psychiatric/Behavioral:  Positive for depression. Negative for hallucinations, memory loss and suicidal ideas. The patient is nervous/anxious and has insomnia.   Blood pressure (!) 119/93, pulse 73, temperature 98.3 F (36.8 C), temperature source Oral, resp. rate 16, height 5\' 5"  (1.651 m), weight 72.6 kg, SpO2 100 %, unknown if currently breastfeeding. Body mass index is 26.63 kg/m.   COGNITIVE FEATURES THAT CONTRIBUTE TO RISK:  None    SUICIDE RISK:   Moderate:  Frequent suicidal ideation with limited intensity, and duration, some specificity in terms of plans, no associated intent, good self-control, limited dysphoria/symptomatology, some risk factors present, and identifiable protective factors, including available and accessible social support.  PLAN OF CARE: Safety and Monitoring --  Admission to inpatient psychiatric unit for safety, stabilization and treatment -- Daily contact with patient to assess and evaluate symptoms and progress in treatment -- Patient's case to be discussed in multi-disciplinary team meeting. -- Patient will be encouraged to participate in the therapeutic group milieu. -- Observation Level : q15 minute checks -- Vital signs:  q12  hours -- Precautions: suicide.  Plan  -Monitor Vitals. -Monitor for thoughts of harm to self or others -Monitor for psychosis, disorganization or changes to cognition -Monitor for withdrawal symptoms. -Monitor for medication side effects.  Labs/Studies: Nutrition G/C, RPR  Medications: deferred   Safety and Monitoring --  Admission to inpatient psychiatric unit for safety, stabilization and treatment -- Daily contact with patient to assess and evaluate symptoms and progress in treatment -- Patient's case to be discussed in multi-disciplinary team meeting. -- Patient will be encouraged to participate in the therapeutic group milieu. -- Observation Level : q15 minute checks -- Vital signs:  q12 hours -- Precautions: suicide.  Plan  -Monitor Vitals. -Monitor for thoughts of harm to self or others -Monitor for psychosis, disorganization or changes to cognition -Monitor for withdrawal symptoms. -Monitor for medication side effects.    I certify that inpatient services furnished can reasonably be expected to improve the patient's condition.   , MD 11/17/2020, 5:07 PM

## 2020-11-17 NOTE — Progress Notes (Signed)
Dar note: Patient presents with a flat affect and depressed mood.  Denies suicidal thoughts, auditory and visual hallucinations.  Described energy level as normal with good concentration.  Rates depression at 6/10, hopelessness at 7/10 and anxiety at 2/10.  Attended group and participated.  Support and encouragement offered as needed.  States goal is "trying to eat again and opening up."  Patient is safe on and off the unit.

## 2020-11-17 NOTE — [facility] Group Notes (Signed)
.  Psychoeducational Group Note  Date: 11/17/2020 Time: 0900-1000    Goal Setting   Purpose of Group: This group helps to provide patients with the steps of setting a goal that is specific, measurable, attainable, realistic and time specific. A discussion on how we keep ourselves stuck with negative self talk.    Participation Level:  did not attend   Dione Housekeeper

## 2020-11-17 NOTE — H&P (Addendum)
Psychiatric Admission Assessment Adult  Patient Identification: Samantha Blair MRN:  [id number] Date of Evaluation:  11/17/2020 Chief Complaint:  MDD (major depressive disorder), recurrent episode, severe (HCC) [F33.2] MDD (major depressive disorder) [F32.9] Principal Diagnosis: MDD (major depressive disorder), recurrent episode, severe (HCC) Diagnosis:  Principal Problem:   MDD (major depressive disorder), recurrent episode, severe (HCC) Active Problems:   MDD (major depressive disorder)  History of Present Illness: Report received, records reviewed and care plan discussed with Members of our interdisciplinary team.  Samantha Blair, 25 years old admitted last night from the ER where she was brought in by her mother for suicide ideation.  Patient at the time in the ER reported that she had a plan to walk into traffic or use a gun or OD on Medications.  Patient lost her 3 boy friend of 3 months by suicide last Tuesday.  Today she was calm but presented a depressed mood and congruent affect.  While in the ER she found out that her pregnancy test result is positive and today she informed this provider that her deceased boyfriend is responsible for the pregnancy.  This is her first admission in a Psychiatric unit but she has diagnosis of depression.  She never used Medications to treat her depression but engages in therapy.  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  She engaged in therapy at the time and declined medications.  In 2014 she engaged in another therapy when she moved into a shelter and had recurrence of Depression.  Patient is employed full time her mother is supportive keeping her three children.  Patient is sad for her loss but today denied feeling suicidal.  She has not eaten or slept since Tuesday.  She tried to eat here but could not.  Today she rated her depression 5/10 with 10 being severe depression.  Patient denied hx of substance abuse.  She is not given  any pregnancy for Depression but is encouraged to participate in therapy while in the unit.  Patient is advised to some food as much as she can.  She denied feeling suicidal and denied hx of suicide attempt.  We reviewed few coping skills including but not limited to walking, yoga, meditation and listening to music.  We discussed the need for an outpatient therapist after discharge.  SW/CM to assist with outpatient therapist.  Patient was advised to stop vaping Nicotine and to avoid any form of Alcohol or drugs and not to take any prescribed medication without speaking her to her obstetrician. Associated Signs/Symptoms: Depression Symptoms:  depressed mood, insomnia, anxiety, loss of energy/fatigue, Duration of Depression Symptoms: Greater than two weeks  (Hypo) Manic Symptoms:   na Anxiety Symptoms:  Excessive Worry, Psychotic Symptoms:   na PTSD Symptoms: NA Total Time spent with patient:  50 minutes  Past Psychiatric History: Depression  Is the patient at risk to self? No.  Has the patient been a risk to self in the past 6 months? Yes.    Has the patient been a risk to self within the distant past? Yes.    Is the patient a risk to others? No.  Has the patient been a risk to others in the past 6 months? No.  Has the patient been a risk to others within the distant past? No.   Prior Inpatient Therapy:   Prior Outpatient Therapy:    Alcohol Screening: 1. How often do you have a drink containing alcohol?: Never 2. How many drinks  containing alcohol do you have on a typical day when you are drinking?: 1 or 2 3. How often do you have six or more drinks on one occasion?: Never AUDIT-C Score: 0 4. How often during the last year have you found that you were not able to stop drinking once you had started?: Never 5. How often during the last year have you failed to do what was normally expected from you because of drinking?: Never 6. How often during the last year have you needed a first  drink in the morning to get yourself going after a heavy drinking session?: Never 7. How often during the last year have you had a feeling of guilt of remorse after drinking?: Never 8. How often during the last year have you been unable to remember what happened the night before because you had been drinking?: Never 9. Have you or someone else been injured as a result of your drinking?: No 10. Has a relative or friend or a doctor or another health worker been concerned about your drinking or suggested you cut down?: No Alcohol Use Disorder Identification Test Final Score (AUDIT): 0 Substance Abuse History in the last 12 months:  No. Consequences of Substance Abuse: NA Previous Psychotropic Medications: No  Psychological Evaluations: Yes  Past Medical History:  Past Medical History:  Diagnosis Date   Abortion    Goiter    left side of neck    Past Surgical History:  Procedure Laterality Date   CESAREAN SECTION     Family History:  Family History  Problem Relation Age of Onset   Cancer Maternal Grandmother    Cancer Maternal Grandfather    Family Psychiatric  History: Aunt-Substance abuse Tobacco Screening:   Social History:  Social History   Substance and Sexual Activity  Alcohol Use No     Social History   Substance and Sexual Activity  Drug Use No    Additional Social History:                           Allergies:   Allergies  Allergen Reactions   Penicillins Hives    Has patient had a PCN reaction causing immediate rash, facial/tongue/throat swelling, SOB or lightheadedness with hypotension: Yes Has patient had a PCN reaction causing severe rash involving mucus membranes or skin necrosis: Yes Has patient had a PCN reaction that required hospitalization Yes Has patient had a PCN reaction occurring within the last 10 years: No If all of the above answers are "NO", then may proceed with Cephalosporin use.    Lab Results:  Results for orders placed or  performed during the hospital encounter of 11/15/20 (from the past 48 hour(s))  Resp Panel by RT-PCR (Flu A&B, Covid) Nasopharyngeal Swab     Status: None   Collection Time: 11/15/20  6:14 PM   Specimen: Nasopharyngeal Swab; Nasopharyngeal(NP) swabs in vial transport medium  Result Value Ref Range   SARS Coronavirus 2 by RT PCR NEGATIVE NEGATIVE    Comment: (NOTE) SARS-CoV-2 target nucleic acids are NOT DETECTED.  The SARS-CoV-2 RNA is generally detectable in upper respiratory specimens during the acute phase of infection. The lowest concentration of SARS-CoV-2 viral copies this assay can detect is 138 copies/mL. A negative result does not preclude SARS-Cov-2 infection and should not be used as the sole basis for treatment or other patient management decisions. A negative result may occur with  improper specimen collection/handling, submission of specimen other than  nasopharyngeal swab, presence of viral mutation(s) within the areas targeted by this assay, and inadequate number of viral copies(<138 copies/mL). A negative result must be combined with clinical observations, patient history, and epidemiological information. The expected result is Negative.  Fact Sheet for Patients:  BloggerCourse.com  Fact Sheet for Healthcare Providers:  SeriousBroker.it  This test is no t yet approved or cleared by the [place] FDA and  has been authorized for detection and/or diagnosis of SARS-CoV-2 by FDA under an Emergency Use Authorization (EUA). This EUA will remain  in effect (meaning this test can be used) for the duration of the COVID-19 declaration under Section 564(b)(1) of the Act, 21 U.S.C.section 360bbb-3(b)(1), unless the authorization is terminated  or revoked sooner.       Influenza A by PCR NEGATIVE NEGATIVE   Influenza B by PCR NEGATIVE NEGATIVE    Comment: (NOTE) The Xpert Xpress SARS-CoV-2/FLU/RSV plus assay is intended  as an aid in the diagnosis of influenza from Nasopharyngeal swab specimens and should not be used as a sole basis for treatment. Nasal washings and aspirates are unacceptable for Xpert Xpress SARS-CoV-2/FLU/RSV testing.  Fact Sheet for Patients: BloggerCourse.com  Fact Sheet for Healthcare Providers: SeriousBroker.it  This test is not yet approved or cleared by the [place] FDA and has been authorized for detection and/or diagnosis of SARS-CoV-2 by FDA under an Emergency Use Authorization (EUA). This EUA will remain in effect (meaning this test can be used) for the duration of the COVID-19 declaration under Section 564(b)(1) of the Act, 21 U.S.C. section 360bbb-3(b)(1), unless the authorization is terminated or revoked.  Performed at [facility], [address], [place], [place] [address]   Comprehensive metabolic panel     Status: Abnormal   Collection Time: 11/15/20  6:14 PM  Result Value Ref Range   Sodium 133 (L) 135 - 145 mmol/L   Potassium 3.9 3.5 - 5.1 mmol/L   Chloride 102 98 - 111 mmol/L   CO2 23 22 - 32 mmol/L   Glucose, Bld 85 70 - 99 mg/dL    Comment: Glucose reference range applies only to samples taken after fasting for at least 8 hours.   BUN 8 6 - 20 mg/dL   Creatinine, Ser 2.70 0.44 - 1.00 mg/dL   Calcium 9.4 8.9 - 78.6 mg/dL   Total Protein 8.4 (H) 6.5 - 8.1 g/dL   Albumin 4.1 3.5 - 5.0 g/dL   AST 18 15 - 41 U/L   ALT 8 0 - 44 U/L   Alkaline Phosphatase 43 38 - 126 U/L   Total Bilirubin 2.0 (H) 0.3 - 1.2 mg/dL   GFR, Estimated >75 >44 mL/min    Comment: (NOTE) Calculated using the CKD-EPI Creatinine Equation (2021)    Anion gap 8 5 - 15    Comment: Performed at [facility], [address], [place], [place] [address]  Ethanol     Status: None   Collection Time: 11/15/20  6:14 PM  Result Value Ref Range   Alcohol, Ethyl (B) <10 <10 mg/dL    Comment: (NOTE) Lowest detectable limit  for serum alcohol is 10 mg/dL.  For medical purposes only. Performed at [facility], [address] [place], [place] [address]   Urine rapid drug screen (hosp performed)     Status: None   Collection Time: 11/15/20  6:14 PM  Result Value Ref Range   Opiates NONE DETECTED NONE DETECTED   Cocaine NONE DETECTED NONE DETECTED  Benzodiazepines NONE DETECTED NONE DETECTED   Amphetamines NONE DETECTED NONE DETECTED   Tetrahydrocannabinol NONE DETECTED NONE DETECTED   Barbiturates NONE DETECTED NONE DETECTED    Comment: (NOTE) DRUG SCREEN FOR MEDICAL PURPOSES ONLY.  IF CONFIRMATION IS NEEDED FOR ANY PURPOSE, NOTIFY LAB WITHIN 5 DAYS.  LOWEST DETECTABLE LIMITS FOR URINE DRUG SCREEN Drug Class                     Cutoff (ng/mL) Amphetamine and metabolites    1000 Barbiturate and metabolites    200 Benzodiazepine                 200 Tricyclics and metabolites     300 Opiates and metabolites        300 Cocaine and metabolites        300 THC                            50 Performed at [facility], [address] [place], [place] [address]   CBC with Diff     Status: Abnormal   Collection Time: 11/15/20  6:14 PM  Result Value Ref Range   WBC 9.8 4.0 - 10.5 K/uL   RBC 4.86 3.87 - 5.11 MIL/uL   Hemoglobin 11.5 (L) 12.0 - 15.0 g/dL   HCT 27.0 35.0 - 09.3 %   MCV 77.6 (L) 80.0 - 100.0 fL   MCH 23.7 (L) 26.0 - 34.0 pg   MCHC 30.5 30.0 - 36.0 g/dL   RDW 81.8 (H) 29.9 - 37.1 %   Platelets 325 150 - 400 K/uL   nRBC 0.0 0.0 - 0.2 %   Neutrophils Relative % 72 %   Neutro Abs 7.2 1.7 - 7.7 K/uL   Lymphocytes Relative 18 %   Lymphs Abs 1.8 0.7 - 4.0 K/uL   Monocytes Relative 8 %   Monocytes Absolute 0.7 0.1 - 1.0 K/uL   Eosinophils Relative 1 %   Eosinophils Absolute 0.1 0.0 - 0.5 K/uL   Basophils Relative 1 %   Basophils Absolute 0.1 0.0 - 0.1 K/uL   Immature Granulocytes 0 %   Abs Immature Granulocytes 0.03 0.00 - 0.07 K/uL    Comment: Performed at [facility], [address] [place], [place] [address]  I-Stat beta hCG blood, ED     Status: Abnormal   Collection Time: 11/15/20  6:40 PM  Result Value Ref Range   I-stat hCG, quantitative 122.3 (H) <5 mIU/mL   Comment 3            Comment:   GEST. AGE      CONC.  (mIU/mL)   <=1 WEEK        5 - 50     2 WEEKS       50 - 500     3 WEEKS       100 - 10,000     4 WEEKS     1,000 - 30,000        Blair AND NON-PREGNANT Blair:     LESS THAN 5 mIU/mL   hCG, quantitative, pregnancy     Status: Abnormal   Collection Time: 11/15/20  7:03 PM  Result Value Ref Range   hCG, Beta Chain, Quant, S 165 (H) <5 mIU/mL    Comment:          GEST. AGE      CONC.  (mIU/mL)   <=  1 WEEK        5 - 50     2 WEEKS       50 - 500     3 WEEKS       100 - 10,000     4 WEEKS     1,000 - 30,000     5 WEEKS     3,500 - 115,000   6-8 WEEKS     12,000 - 270,000    12 WEEKS     15,000 - 220,000        Blair AND NON-PREGNANT Blair:     LESS THAN 5 mIU/mL Performed at [facility], [address] [place], [place] [address]     Blood Alcohol level:  Lab Results  Component Value Date   ETH <10 11/15/2020    Metabolic Disorder Labs:  No results found for: HGBA1C, MPG No results found for: PROLACTIN No results found for: CHOL, TRIG, HDL, CHOLHDL, VLDL, LDLCALC  Current Medications: Current Facility-Administered Medications  Medication Dose Route Frequency Provider Last Rate Last Admin   acetaminophen (TYLENOL) tablet 650 mg  650 mg Oral Q6H PRN Lenard Lance, FNP       PTA Medications: Medications Prior to Admission  Medication Sig Dispense Refill Last Dose   dicyclomine (BENTYL) 20 MG tablet Take 1 tablet (20 mg total) by mouth 2 (two) times daily as needed (abdominal cramping). (Patient not taking: Reported on 11/15/2020) 20 tablet 0    docusate sodium (COLACE) 100 MG capsule Take 1 capsule (100 mg total) by mouth 2 (two) times daily. (Patient not taking: Reported on 11/15/2020) 30 capsule 0     ferrous sulfate 325 (65 FE) MG tablet Take 1 tablet (325 mg total) by mouth 2 (two) times daily with a meal. (Patient not taking: Reported on 11/15/2020) 30 tablet 0    ibuprofen (ADVIL,MOTRIN) 600 MG tablet Take 1 tablet (600 mg total) by mouth every 6 (six) hours. (Patient not taking: Reported on 11/15/2020) 30 tablet 0    ondansetron (ZOFRAN ODT) 4 MG disintegrating tablet Take 1 tablet (4 mg total) by mouth every 8 (eight) hours as needed for nausea or vomiting. (Patient not taking: Reported on 11/15/2020) 20 tablet 0    Prenatal Vit-Fe Fumarate-FA (PNV PRENATAL PLUS MULTIVITAMIN) 27-1 MG TABS Take 1 tablet by mouth daily.  (Patient not taking: Reported on 11/15/2020)  6     Musculoskeletal: Strength & Muscle Tone: within normal limits Gait & Station: normal Patient leans: Front            Psychiatric Specialty Exam:  Presentation  General Appearance: Appropriate for Environment; Casual; Neat Eye Contact:Fair Speech:Clear and Coherent; Slow Speech Volume:Decreased Handedness:Right  Mood and Affect  Mood:Anxious; Depressed Affect:Congruent; Depressed; Tearful  Thought Process  Thought Processes:Coherent; Linear; Goal Directed Duration of Psychotic Symptoms: No data recorded Past Diagnosis of Schizophrenia or Psychoactive disorder: No  Descriptions of Associations:Intact Orientation:Full (Time, Place and Person) Thought Content:Logical Hallucinations:Hallucinations: None Ideas of Reference:None Suicidal Thoughts:Suicidal Thoughts: No Homicidal Thoughts:Homicidal Thoughts: No  Sensorium  Memory:Immediate Good; Recent Good; Remote Good Judgment:Fair Insight:Fair  Executive Functions  Concentration:Good Attention Span:Good Recall:Good Fund of Knowledge:Good Language:Good  Psychomotor Activity  Psychomotor Activity:Psychomotor Activity: Normal  Assets  Assets:Communication Skills; Desire for Improvement; Financial Resources/Insurance; Housing;  Talents/Skills; Physical Health; Transportation  Sleep  Sleep:Sleep: Poor Number of Hours of Sleep: 0.25   Physical Exam: Physical Exam Vitals and nursing note reviewed.  Constitutional:      Appearance:  Normal appearance.  HENT:     Head: Normocephalic and atraumatic.     Nose: Nose normal.  Cardiovascular:     Rate and Rhythm: Normal rate and regular rhythm.  Pulmonary:     Effort: Pulmonary effort is normal.  Musculoskeletal:        General: Normal range of motion.     Cervical back: Normal range of motion.  Skin:    General: Skin is warm and dry.  Neurological:     General: No focal deficit present.     Mental Status: She is alert and oriented to person, place, and time.   Review of Systems  Constitutional: Negative.   HENT: Negative.    Eyes: Negative.   Respiratory: Negative.    Cardiovascular: Negative.   Gastrointestinal: Negative.   Genitourinary: Negative.   Musculoskeletal: Negative.   Skin: Negative.   Neurological: Negative.   Endo/Heme/Allergies: Negative.   Psychiatric/Behavioral:  Positive for depression and suicidal ideas. The patient has insomnia.   Blood pressure 119/82, pulse 84, temperature 98.3 F (36.8 C), temperature source Oral, resp. rate 16, height 5\' 5"  (1.651 m), weight 72.6 kg, SpO2 100 %, unknown if currently breastfeeding. Body mass index is 26.63 kg/m.  Treatment Plan Summary: Daily contact with patient to assess and evaluate symptoms and progress in treatment and Medication management Offer PRN medications Encourage Group participation. Observation Level/Precautions:  15 minute checks  Laboratory:  CBC Chemistry Profile HCG UDS-Positive pregnancy test, rest of result  insignificant, negative  Psychotherapy:  Encouraged to participate in therapy  Medications:  See MAR  Consultations:  None  Discharge Concerns:  Difficulty Griving  Estimated LOS:3-5 days  Other:     Physician Treatment Plan for Primary Diagnosis: MDD (major  depressive disorder), recurrent episode, severe (HCC) Long Term Goal(s): Improvement in symptoms so as ready for discharge  Short Term Goals: Ability to identify changes in lifestyle to reduce recurrence of condition will improve, Ability to verbalize feelings will improve, Ability to disclose and discuss suicidal ideas, Ability to demonstrate self-control will improve, Ability to identify and develop effective coping behaviors will improve, Ability to maintain clinical measurements within normal limits will improve, Compliance with prescribed medications will improve, and Ability to identify triggers associated with substance abuse/mental health issues will improve  Physician Treatment Plan for Secondary Diagnosis: Principal Problem:   MDD (major depressive disorder), recurrent episode, severe (HCC) Active Problems:   MDD (major depressive disorder)  Long Term Goal(s): Improvement in symptoms so as ready for discharge  Short Term Goals: Ability to identify changes in lifestyle to reduce recurrence of condition will improve, Ability to verbalize feelings will improve, Ability to disclose and discuss suicidal ideas, Ability to demonstrate self-control will improve, Ability to identify and develop effective coping behaviors will improve, Ability to maintain clinical measurements within normal limits will improve, Compliance with prescribed medications will improve, and Ability to identify triggers associated with substance abuse/mental health issues will improve  I certify that inpatient services furnished can reasonably be expected to improve the patient's condition.    , NP-PMHNP-BC 10/22/20222:01 PM

## 2020-11-17 NOTE — Group Note (Signed)
LCSW Group Therapy Note  No therapy group could be held this day due to staffing issues.  Another licensed group was held.  Zachariah Pavek Grossman-Orr, LCSW 11/17/2020 11:37 AM     

## 2020-11-18 DIAGNOSIS — E559 Vitamin D deficiency, unspecified: Secondary | ICD-10-CM | POA: Diagnosis present

## 2020-11-18 LAB — BASIC METABOLIC PANEL
Anion gap: 6 (ref 5–15)
BUN: 11 mg/dL (ref 6–20)
CO2: 25 mmol/L (ref 22–32)
Calcium: 9.1 mg/dL (ref 8.9–10.3)
Chloride: 103 mmol/L (ref 98–111)
Creatinine, Ser: 0.76 mg/dL (ref 0.44–1.00)
GFR, Estimated: >60 mL/min (ref >60)
Glucose, Bld: 90 mg/dL (ref 70–99)
Potassium: 4.1 mmol/L (ref 3.5–5.1)
Sodium: 134 mmol/L — ABNORMAL LOW (ref 135–145)

## 2020-11-18 LAB — MAGNESIUM: Magnesium: 2.5 mg/dL — ABNORMAL HIGH (ref 1.7–2.4)

## 2020-11-18 LAB — VITAMIN D 25 HYDROXY (VIT D DEFICIENCY, FRACTURES): Vit D, 25-Hydroxy: 14.22 ng/mL — ABNORMAL LOW (ref 30–100)

## 2020-11-18 LAB — VITAMIN B12: Vitamin B-12: 623 pg/mL (ref 180–914)

## 2020-11-18 LAB — RPR: RPR Ser Ql: NONREACTIVE

## 2020-11-18 MED ORDER — MELATONIN 5 MG PO TABS
5.0000 mg | ORAL_TABLET | Freq: Every day | ORAL | Status: DC
  Administered 2020-11-18: 5 mg via ORAL
  Filled 2020-11-18: qty 1
  Filled 2020-11-18: qty 7
  Filled 2020-11-18 (×2): qty 1

## 2020-11-18 MED ORDER — DIPHENHYDRAMINE HCL 25 MG PO CAPS
25.0000 mg | ORAL_CAPSULE | Freq: Every evening | ORAL | Status: DC | PRN
  Administered 2020-11-18: 25 mg via ORAL
  Filled 2020-11-18: qty 1

## 2020-11-18 MED ORDER — VITAMIN D (ERGOCALCIFEROL) 1.25 MG (50000 UNIT) PO CAPS
50000.0000 [IU] | ORAL_CAPSULE | ORAL | Status: DC
  Administered 2020-11-19: 50000 [IU] via ORAL
  Filled 2020-11-18: qty 1

## 2020-11-18 NOTE — Progress Notes (Signed)
   11/18/20 0000  Psych Admission Type (Psych Patients Only)  Admission Status Voluntary  Psychosocial Assessment  Patient Complaints Depression;Insomnia  Eye Contact Brief  Facial Expression Flat  Affect Depressed  Speech Logical/coherent  Interaction Assertive  Motor Activity Slow  Appearance/Hygiene In scrubs  Behavior Characteristics Cooperative  Mood Depressed  Thought Process  Coherency WDL  Content WDL  Delusions None reported or observed  Perception WDL  Hallucination None reported or observed  Judgment Poor  Confusion WDL  Danger to Self  Current suicidal ideation? Denies  Danger to Others  Danger to Others None reported or observed

## 2020-11-18 NOTE — Progress Notes (Signed)
Adult Psychoeducational Group Note  Date:  11/18/2020 Time:  8:38 PM  Group Topic/Focus:  Wrap-Up Group:   The focus of this group is to help patients review their daily goal of treatment and discuss progress on daily workbooks.  Participation Level:  Active  Participation Quality:  Appropriate  Affect:  Appropriate  Cognitive:  Appropriate  Insight: Appropriate  Engagement in Group:  Engaged  Modes of Intervention:  Exploration  Additional Comments:  patient attended and participated in group tonight. She reports that today she was more sociable. Her goal for tomorrow is to go home.  Lita Mains [facility] 11/18/2020, 8:38 PM
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
## 2020-11-18 NOTE — [facility] Group Notes (Signed)
Psychoeducational Group Note  Date: 11-18-20 Time:  1300  Group Topic/Focus:  Making Healthy Choices:   The focus of this group is to help patients identify negative/unhealthy choices they were using prior to admission and identify positive/healthier coping strategies to replace them upon discharge.In this group, patients started asking about the brain and how the brain works with and how the chemicals work for those who use substances, the pros and cons of saboxone.  Participation Level:  Active  Participation Quality:  Appropriate  Affect:  Appropriate  Cognitive:  Oriented  Insight:  Improving  Engagement in Group:  Engaged  Additional Comments:. Pt rates her energy at a 9/10 today. Affect brighter. Interacting with her peers and sharing her thoughts and feelings in the group. Listening  to everyone and sharing some of her own thoughts and feelings.  Dione Housekeeper

## 2020-11-18 NOTE — Progress Notes (Addendum)
D: Patient has been in her room sleeping this morning. She complained of insomnia prior to today; however, it was recorded that she slept 5 hours last night and she has slept 4 hours today. Patient denies any thoughts of self harm. She denies any auditory/visual hallucinations. Patient denies any depressive symptoms; denies any hopelessness. She rates her symptoms as 0 today. Her mother called the unit to inquire about patient's stay and did not have the code number. Informed patient to give code number to her mother if she wanted her to know she was here.   A: Continue to monitor medication management and MD orders.  Safety checks completed every 15 minutes per protocol.  Offer support and encouragement as needed.  R: Patient is receptive to staff; she remains isolative to her room.  11/18/20 1100  Psych Admission Type (Psych Patients Only)  Admission Status Voluntary  Psychosocial Assessment  Patient Complaints Depression  Eye Contact Brief  Facial Expression Flat  Affect Depressed  Speech Logical/coherent  Interaction Avoidant  Motor Activity Slow  Appearance/Hygiene Unremarkable  Behavior Characteristics Unwilling to participate  Mood Depressed  Thought Process  Coherency WDL  Content WDL  Delusions None reported or observed  Perception WDL  Hallucination None reported or observed  Judgment Poor  Confusion WDL  Danger to Self  Current suicidal ideation? Denies  Danger to Others  Danger to Others None reported or observed

## 2020-11-18 NOTE — [facility] Suicide Risk Assessment (Signed)
[facility] INPATIENT:  Family/Significant Other Suicide Prevention Education  Suicide Prevention Education:  Education Completed; mother Samantha Blair [phone],  (name of family member/significant other) has been identified by the patient as the family member/significant other with whom the patient will be residing, and identified as the person(s) who will aid the patient in the event of a mental health crisis (suicidal ideations/suicide attempt).    Mother states that since patient's first pregnancy 10 years ago, she has never been the same.  She gets overly anxious and overwhelmed.  Mother thinks that she likely needs medications even during her pregnancy.  She would like for her to see a doctor after discharge to continue that discussion, since she has not been started on anything in hospital.  Mother also states she has not been sleeping while hospitalized, so asks if there is anything she could be given.  They have been talking about the patient and her children coming to mother's house to stay for awhile at discharge.  With written consent from the patient, the family member/significant other has been provided the following suicide prevention education, prior to the and/or following the discharge of the patient.  The suicide prevention education provided includes the following: Suicide risk factors Suicide prevention and interventions National Suicide Hotline telephone number [facility] assessment telephone number [facility] Emergency Assistance 911 [facility] and/or Residential Mobile Crisis Unit telephone number  Request made of family/significant other to: Remove weapons (e.g., guns, rifles, knives), all items previously/currently identified as safety concern.   Remove drugs/medications (over-the-counter, prescriptions, illicit drugs), all items previously/currently identified as a safety concern.  The family member/significant other verbalizes understanding of the  suicide prevention education information provided.  The family member/significant other agrees to remove the items of safety concern listed above.  Samantha Blair 11/18/2020, 2:06 PM

## 2020-11-18 NOTE — [facility] Group Notes (Signed)
Adult Psychoeducational Group Not Date:  11/18/2020 Time:  0900-1045 Group Topic/Focus: PROGRESSIVE RELAXATION. Blair group where deep breathing is taught and tensing and relaxation muscle groups is used. Imagery is used as well.  Pts are asked to imagine 3 pillars that hold them up when they are not able to hold themselves up.  Participation Level:  Did not attend   Samantha Blair  

## 2020-11-18 NOTE — Progress Notes (Addendum)
[facility] MD Progress Note  11/18/2020 12:22 PM Samantha Blair  MRN:  [id number] Subjective:  " I am doing better, slept well last night" Reason for admission:   AA female, 25 years old admitted last night from the ER where she was brought in by her mother for suicide ideation.  Patient at the time in the ER reported that she had a plan to walk into traffic or use a gun or OD on Medications.  Patient lost her boy friend of 3 months by suicide last Tuesday.  Today she was calm but presented a depressed mood and congruent affect.  While in the ER she found out that her pregnancy test result is positive and today she informed this provider that her deceased boyfriend is responsible for the pregnancy.  Principal Problem: MDD (major depressive disorder), recurrent episode, severe (HCC) Diagnosis: Principal Problem:   MDD (major depressive disorder), recurrent episode, severe (HCC) Active Problems:   MDD (major depressive disorder) Objective note:  Notes reviewed, nursing report received and care reviewed with members of our interdisciplinary team.  Patient was seen in room this morning sleeping.  She aroused easily and reported some mood improvement.  She is currently not taking medications except for PRN medications due to her pregnancy.  She denied suicidal ideation and contracted for safety.  She is interested in group activities but was sleeping this morning and missed one.  Patient is eating here and reported last night was her best sleep since boyfriend passed on.  Patient signed 72 hours form which will be up on Tuesday.  So far she reported the benefit of her coming to the unit but misses her children.  SW will assist with finding  her an obstetrician.Affect is brighter today and she started eating in the unit.  Advised to participate in therapy while in the unit. Total Time spent with patient: 30 minutes  Past Psychiatric History: see H&P note  Past Medical History:  Past Medical History:  Diagnosis  Date   Abortion    Goiter    left side of neck    Past Surgical History:  Procedure Laterality Date   CESAREAN SECTION     Family History:  Family History  Problem Relation Age of Onset   Cancer Maternal Grandmother    Cancer Maternal Grandfather    Family Psychiatric  History: see H&P note Social History:  Social History   Substance and Sexual Activity  Alcohol Use No     Social History   Substance and Sexual Activity  Drug Use No    Social History   Socioeconomic History   Marital status: Single    Spouse name: Not on file   Number of children: Not on file   Years of education: Not on file   Highest education level: Not on file  Occupational History   Not on file  Tobacco Use   Smoking status: Former   Smokeless tobacco: Never  Vaping Use   Vaping Use: Every day  Substance and Sexual Activity   Alcohol use: No   Drug use: No   Sexual activity: Yes    Birth control/protection: None  Other Topics Concern   Not on file  Social History Narrative   Not on file   Social Determinants of Health   Financial Resource Strain: Not on file  Food Insecurity: Not on file  Transportation Needs: Not on file  Physical Activity: Not on file  Stress: Not on file  Social Connections: Not on file  Additional Social History:                         Sleep: Fair  Appetite:  Fair  Current Medications: Current Facility-Administered Medications  Medication Dose Route Frequency Provider Last Rate Last Admin   acetaminophen (TYLENOL) tablet 650 mg  650 mg Oral Q6H PRN Lenard Lance, FNP        Lab Results:  Results for orders placed or performed during the hospital encounter of 11/16/20 (from the past 48 hour(s))  TSH     Status: None   Collection Time: 11/17/20  6:45 PM  Result Value Ref Range   TSH 0.912 0.350 - 4.500 uIU/mL    Comment: Performed by a 3rd Generation assay with a functional sensitivity of <=0.01 uIU/mL. Performed at [facility], [address] [place], [place] [address]   RPR     Status: None   Collection Time: 11/17/20  6:45 PM  Result Value Ref Range   RPR Ser Ql NON REACTIVE NON REACTIVE    Comment: Performed at [facility], [address] [place], [place] [address]  Basic metabolic panel     Status: Abnormal   Collection Time: 11/18/20  6:50 AM  Result Value Ref Range   Sodium 134 (L) 135 - 145 mmol/L   Potassium 4.1 3.5 - 5.1 mmol/L   Chloride 103 98 - 111 mmol/L   CO2 25 22 - 32 mmol/L   Glucose, Bld 90 70 - 99 mg/dL    Comment: Glucose reference range applies only to samples taken after fasting for at least 8 hours.   BUN 11 6 - 20 mg/dL   Creatinine, Ser 2.84 0.44 - 1.00 mg/dL   Calcium 9.1 8.9 - 13.2 mg/dL   GFR, Estimated >44 >01 mL/min    Comment: (NOTE) Calculated using the CKD-EPI Creatinine Equation (2021)    Anion gap 6 5 - 15    Comment: Performed at [facility], [address], [place], [place] [address]  Magnesium     Status: Abnormal   Collection Time: 11/18/20  6:50 AM  Result Value Ref Range   Magnesium 2.5 (H) 1.7 - 2.4 mg/dL    Comment: Performed at [facility], [address] [place], [place] [address]  Vitamin B12     Status: None   Collection Time: 11/18/20  6:50 AM  Result Value Ref Range   Vitamin B-12 623 180 - 914 pg/mL    Comment: (NOTE) This assay is not validated for testing neonatal or myeloproliferative syndrome specimens for Vitamin B12 levels. Performed at [facility], [address] [place], [place] [address]     Blood Alcohol level:  Lab Results  Component Value Date   ETH <10 11/15/2020    Metabolic Disorder Labs: No results found for: HGBA1C, MPG No results found for: PROLACTIN No results found for: CHOL, TRIG, HDL, CHOLHDL, VLDL, LDLCALC  Physical Findings: AIMS: Facial and Oral Movements Muscles of Facial Expression: None, normal Lips and Perioral Area:  None, normal Jaw: None, normal Tongue: None, normal,Extremity Movements Upper (arms, wrists, hands, fingers): None, normal Lower (legs, knees, ankles, toes): None, normal, Trunk Movements Neck, shoulders, hips: None, normal, Overall Severity Severity of abnormal movements (highest score from questions above): None, normal Incapacitation due to abnormal movements: None, normal Patient's awareness of abnormal movements (rate only patient's report): No Awareness, Dental Status Current problems with teeth and/or  dentures?: No Does patient usually wear dentures?: No  CIWA:    COWS:     Musculoskeletal: Strength & Muscle Tone: within normal limits Gait & Station: normal Patient leans: Front  Psychiatric Specialty Exam:  Presentation  General Appearance: Appropriate for Environment; Fairly Groomed; Neat  Eye Contact:Good  Speech:Normal Rate; Clear and Coherent  Speech Volume:Normal  Handedness:Right   Mood and Affect  Mood:Depressed  Affect:Congruent   Thought Process  Thought Processes:Coherent  Descriptions of Associations:Intact  Orientation:Full (Time, Place and Person)  Thought Content:Logical  History of Schizophrenia/Schizoaffective disorder:No  Duration of Psychotic Symptoms:No data recorded Hallucinations:Hallucinations: None  Ideas of Reference:None  Suicidal Thoughts:Suicidal Thoughts: No  Homicidal Thoughts:Homicidal Thoughts: No   Sensorium  Memory:Immediate Good; Recent Good; Remote Good  Judgment:Good  Insight:Good   Executive Functions  Concentration:Good  Attention Span:Good  Recall:Good  Fund of Knowledge:Good  Language:Good   Psychomotor Activity  Psychomotor Activity:Psychomotor Activity: Normal   Assets  Assets:Communication Skills; Desire for Improvement; Resilience; Financial Resources/Insurance; Housing; Physical Health   Sleep  Sleep:Sleep: Fair Number of Hours of Sleep: 5    Physical Exam: Physical  Exam Vitals and nursing note reviewed.  Constitutional:      Appearance: Normal appearance.  HENT:     Head: Normocephalic and atraumatic.     Nose: Nose normal.  Cardiovascular:     Rate and Rhythm: Normal rate and regular rhythm.  Pulmonary:     Effort: Pulmonary effort is normal.  Musculoskeletal:        General: Normal range of motion.     Cervical back: Normal range of motion.  Skin:    General: Skin is warm and dry.  Neurological:     General: No focal deficit present.     Mental Status: She is alert and oriented to person, place, and time.   Review of Systems  Constitutional: Negative.   HENT: Negative.    Eyes: Negative.   Respiratory: Negative.    Cardiovascular: Negative.   Gastrointestinal: Negative.   Genitourinary: Negative.   Musculoskeletal: Negative.   Skin: Negative.   Neurological: Negative.   Endo/Heme/Allergies: Negative.   Psychiatric/Behavioral:  Positive for depression. The patient is nervous/anxious.   Blood pressure (!) 119/91, pulse 78, temperature 98.6 F (37 C), temperature source Oral, resp. rate 16, height 5\' 5"  (1.651 m), weight 72.6 kg, SpO2 100 %, unknown if currently breastfeeding. Body mass index is 26.63 kg/m.  Treatment Plan Summary: Daily contact with patient to assess and evaluate symptoms and progress in treatment and Medication management Offer PRN medications per Protocol-Patient is two weeks pregnant. , NP-PMHNP-BC 11/18/2020, 12:22 PM

## 2020-11-18 NOTE — [facility] Group Notes (Signed)
Patient did not attend Group today.

## 2020-11-19 ENCOUNTER — Encounter (HOSPITAL_COMMUNITY): Payer: Self-pay

## 2020-11-19 LAB — GC/CHLAMYDIA PROBE AMP (~~LOC~~) NOT AT ARMC
Chlamydia: NEGATIVE
Comment: NEGATIVE
Comment: NORMAL
Neisseria Gonorrhea: NEGATIVE

## 2020-11-19 NOTE — [facility] Group Notes (Signed)
Patient was given material for group today 

## 2020-11-19 NOTE — Group Note (Signed)
Recreation Therapy Group Note   Group Topic:Stress Management  Group Date: 11/19/2020 Start Time: 0935 End Time: 1050 Facilitators: Caroll Rancher, LRT/CTRS Location: [address] Dayroom  Goal Area(s) Addresses:  Patient will identify positive stress management techniques. Patient will identify benefits of using stress management post d/c.  Group Description:  Meditation.  LRT played a meditation that focused on having patience when dealing with situations beyond our control.  Patients were to sit and focus as the meditation played to fully engage.   Affect/Mood: N/A   Participation Level: Did not attend     Clinical Observations/Individualized Feedback: Pt did not attend group.    Plan: Continue to engage patient in RT group sessions 2-3x/week.   Caroll Rancher, LRT/CTRS 11/19/2020 1:34 PM

## 2020-11-19 NOTE — Group Note (Signed)
LCSW Group Therapy Note  Group Date: 11/19/2020 Start Time: 1300 End Time: 1400   Type of Therapy and Topic:  Group Therapy: Anger Cues and Responses  Participation Level:  Did Not Attend   Description of Group:   In this group, patients learned how to recognize the physical, cognitive, emotional, and behavioral responses they have to anger-provoking situations.  They identified a recent time they became angry and how they reacted.  They analyzed how their reaction was possibly beneficial and how it was possibly unhelpful.  The group discussed a variety of healthier coping skills that could help with such a situation in the future.  Focus was placed on how helpful it is to recognize the underlying emotions to our anger, because working on those can lead to a more permanent solution as well as our ability to focus on the important rather than the urgent.  Therapeutic Goals: Patients will remember their last incident of anger and how they felt emotionally and physically, what their thoughts were at the time, and how they behaved. Patients will identify how their behavior at that time worked for them, as well as how it worked against them. Patients will explore possible new behaviors to use in future anger situations. Patients will learn that anger itself is normal and cannot be eliminated, and that healthier reactions can assist with resolving conflict rather than worsening situations.  Summary of Patient Progress:  Did not attend  Therapeutic Modalities:   Cognitive Behavioral Therapy    Beatris Si, LCSW 11/19/2020  2:01 PM

## 2020-11-19 NOTE — Progress Notes (Signed)
Pt discharged to lobby. Pt was stable and appreciative at that time. All papers and prescriptions were given and valuables returned. Verbal understanding expressed. Denies SI/HI and A/VH. Pt given opportunity to express concerns and ask questions.  

## 2020-11-19 NOTE — Progress Notes (Signed)
Patient interacting well with peers and Staff. C/O sleep disturbance at 2241 Prn  Benadryl 25 mg PO given and effective by 2350. Patient denies SI/HI/A/VH and verbally contracted for safety. Patient endorses depression 5/10 and anxiety 3/10 this shift. Support and encouragement provided.  Patient remains safe. No adverse drug noted.

## 2020-11-19 NOTE — Discharge Summary (Addendum)
Physician Discharge Summary Note  Patient:  Samantha Blair is an 25 y.o., female MRN:  [id number] DOB:  May 18, 1995 Patient phone:  [phone] (home)  Patient address:   [address] [place] [address],  Total Time spent with patient: 30 minutes  Date of Admission:  11/16/2020 Date of Discharge: 11/19/2020  Reason for Admission:   Ms. Loudenslager is a 25 yr old female who presents with SI. 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  Her boyfriend of 3 months complete suicide last Tuesday.  She reported that after that she began having thoughts of SI by walking into traffic or using a gun or overdosing on medications.  She was brought to the ER by her mother and found to be pregnant by her pregnancy test.   Hospital Course:   During hospitalization, patient was evaluated by the psychiatric team.  They received multiple disciplinary care.  After initial intake evaluation, it was decided to not start medications because she is [redacted] weeks pregnant. She was wanting to restart therapy as she had found success with it in the past. Patient received supportive psychotherapy and was encouraged to participate in group therapy during the hospitalization. Patient denied having suicidal thoughts more than 48 hours prior to discharge.   On day of discharge patient reports that she is doing fine just a little tired.  She reports that her appetite has been doing good.  She reports that her sleep was okay last night.  She reports no SI, HI, or AVH. Discussed with her that since she is pregnant we will not recommend any medications until the second trimester.  She is still willing to start therapy.  Discussed with her the importance of keeping up with those appointments and with an outpatient psychiatrist as she does have a history of postpartum depression.  She reported understanding and was agreeable to this. Discussed with her that  should a crisis arise she can return here 24/7, go to the [facility], go to the nearest ED, or call 911 or [phone].  She reported understanding. She has no other concerns at present.  She was discharged home.    Principal Problem: MDD (major depressive disorder), recurrent episode, severe (HCC) Discharge Diagnoses: Principal Problem:   MDD (major depressive disorder), recurrent episode, severe (HCC) Active Problems:   MDD (major depressive disorder)   Vitamin D deficiency   Past Psychiatric History: Post-Partum Depression  Past Medical History:  Past Medical History:  Diagnosis Date   Abortion    Goiter    left side of neck    Past Surgical History:  Procedure Laterality Date   CESAREAN SECTION     Family History:  Family History  Problem Relation Age of Onset   Cancer Maternal Grandmother    Cancer Maternal Grandfather    Family Psychiatric  History: Aunt-Substance abuse Social History:  Social History   Substance and Sexual Activity  Alcohol Use No     Social History   Substance and Sexual Activity  Drug Use No    Social History   Socioeconomic History   Marital status: Single    Spouse name: Not on file   Number of children: Not on file   Years of education: Not on file   Highest education level: Not on file  Occupational History   Not on file  Tobacco Use   Smoking status: Former   Smokeless tobacco: 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  Substance and Sexual Activity   Alcohol use: No   Drug use: No   Sexual activity: Yes    Birth control/protection: None  Other Topics Concern   Not on file  Social History Narrative   Not on file   Social Determinants of Health   Financial Resource Strain: Not on file  Food Insecurity: Not on file  Transportation Needs: Not on file  Physical Activity: Not on file  Stress: Not on file  Social Connections: Not on file    Physical Findings:   Musculoskeletal: Strength & Muscle Tone: within normal  limits Gait & Station: normal Patient leans: N/A   Psychiatric Specialty Exam:  Presentation  General Appearance: Appropriate for Environment; Casual; Fairly Groomed  Eye Contact:Good  Speech:Clear and Coherent; Normal Rate  Speech Volume:Normal  Handedness:Right   Mood and Affect  Mood:Euthymic  Affect:Appropriate; Congruent   Thought Process  Thought Processes:Coherent; Goal Directed  Descriptions of Associations:Intact  Orientation:Full (Time, Place and Person)  Thought Content:Logical; WDL  History of Schizophrenia/Schizoaffective disorder:No  Duration of Psychotic Symptoms:No data recorded Hallucinations:Hallucinations: None  Ideas of Reference:None  Suicidal Thoughts:Suicidal Thoughts: No  Homicidal Thoughts:Homicidal Thoughts: No   Sensorium  Memory:Immediate Good; Recent Good  Judgment:Good  Insight:Good   Executive Functions  Concentration:Good  Attention Span:Good  Recall:Good  Fund of Knowledge:Good  Language:Good   Psychomotor Activity  Psychomotor Activity:Psychomotor Activity: Normal   Assets  Assets:Communication Skills; Desire for Improvement; Physical Health; Resilience   Sleep  Sleep:Sleep: Good Number of Hours of Sleep: 6.25    Physical Exam: Physical Exam Vitals and nursing note reviewed.  Constitutional:      General: She is not in acute distress.    Appearance: Normal appearance. She is normal weight. She is not ill-appearing or toxic-appearing.  HENT:     Head: Normocephalic and atraumatic.  Pulmonary:     Effort: Pulmonary effort is normal.  Musculoskeletal:        General: Normal range of motion.  Neurological:     General: No focal deficit present.     Mental Status: She is alert.   Review of Systems  Respiratory:  Negative for cough and shortness of breath.   Cardiovascular:  Negative for chest pain.  Gastrointestinal:  Negative for abdominal pain, constipation, diarrhea, nausea and vomiting.   Neurological:  Negative for weakness and headaches.  Psychiatric/Behavioral:  Negative for depression, hallucinations and suicidal ideas. The patient is not nervous/anxious.   Blood pressure 104/81, pulse 87, temperature 98.7 F (37.1 C), temperature source Oral, resp. rate 16, height 5\' 5"  (1.651 m), weight 72.6 kg, SpO2 100 %, unknown if currently breastfeeding. Body mass index is 26.63 kg/m.   Social History   Tobacco Use  Smoking Status Former  Smokeless Tobacco Never   Tobacco Cessation:  A prescription for an FDA-approved tobacco cessation medication was offered at discharge and the patient refused   Blood Alcohol level:  Lab Results  Component Value Date   ETH <10 11/15/2020    Metabolic Disorder Labs:  No results found for: HGBA1C, MPG No results found for: PROLACTIN No results found for: CHOL, TRIG, HDL, CHOLHDL, VLDL, LDLCALC  See Psychiatric Specialty Exam and Suicide Risk Assessment completed by Attending Physician prior to discharge.  Discharge destination:  Home  Is patient on multiple antipsychotic therapies at discharge:  No   Has Patient had three or more failed trials of antipsychotic monotherapy by history:  No  Recommended Plan for Multiple Antipsychotic Therapies: NA  Prescriptions given at discharge. Patient agreeable to plan. Given opportunity to ask questions. Appears to feel comfortable with discharge denies any current suicidal or homicidal thought.  Patient is also instructed prior to discharge to: Take all medications as prescribed by mental healthcare provider. Report any adverse effects and or reactions from the medicines to outpatient provider promptly. Patient has been instructed & cautioned: To not engage in alcohol and or illegal drug use while on prescription medicines. In the event of worsening symptoms,  patient is instructed to call the crisis hotline, 911 and or go to the nearest ED for appropriate evaluation and treatment of symptoms.  To follow-up with primary care provider for other medical issues, concerns and or health care needs  The patient was evaluated each day by a clinical provider to ascertain response to treatment. Improvement was noted by the patient's report of decreasing symptoms, improved sleep and appetite, affect, medication tolerance, behavior, and participation in unit programming.  Patient was asked each day to complete a self inventory noting mood, mental status, pain, new symptoms, anxiety and concerns.  Patient responded well to medication and being in a therapeutic and supportive environment. Positive and appropriate behavior was noted and the patient was motivated for recovery. The patient worked closely with the treatment team and case manager to develop a discharge plan with appropriate goals. Coping skills, problem solving as well as relaxation therapies were also part of the unit programming.  By the day of discharge patient was in much improved condition than upon admission.  Symptoms were reported as significantly decreased or resolved completely. The patient denied SI/HI and voiced no AVH. The patient was motivated to continue taking medication with a goal of continued improvement in mental health.   Patient was discharged home with a plan to follow up as noted below.   Discharge Instructions     Diet - low sodium heart healthy   Complete by: As directed    Increase activity slowly   Complete by: As directed       Allergies as of 11/19/2020       Reactions   Penicillins Hives   Has patient had a PCN reaction causing immediate rash, facial/tongue/throat swelling, SOB or lightheadedness with hypotension: Yes Has patient had a PCN reaction causing severe rash involving mucus membranes or skin necrosis: Yes Has patient had a PCN reaction that required hospitalization Yes Has patient had a PCN reaction occurring within the last 10 years: No If all of the above answers are "NO", then may  proceed with Cephalosporin use.        Medication List     STOP taking these medications    dicyclomine 20 MG tablet Commonly known as: BENTYL   docusate sodium 100 MG capsule Commonly known as: Colace   ferrous sulfate 325 (65 FE) MG tablet   ibuprofen 600 MG tablet Commonly known as: ADVIL   ondansetron 4 MG disintegrating tablet Commonly known as: Zofran ODT       TAKE these medications      Indication  PNV Prenatal Plus Multivitamin 27-1 MG Tabs Take 1 tablet by mouth daily.  Indication: Pregnancy        Follow-up Information     [facility] Follow up.   Specialty: Behavioral Health Why: Please go to this provider for outpatient therapy and medication management services during walk in hours:  Monday through Wednesday from 7:45 am to 11:00 am.  Services are provided on a first come,  first served basis (please arrive early).  These services will be unavailabe from 11/19/20 to 11/23/20. Contact information: [address] [place] [address] [phone]        [facility] Follow up on 12/04/2020.   Why: You have a primary care appointment on 12/04/2020 at 2:30pm.  This doctor will refer you to an OB/GYN for your pregnancy care. Contact information: [address] [place] [address] [phone]                Follow-up recommendations:  - Activity as tolerated. - Diet as recommended by PCP. - Keep all scheduled follow-up appointments as recommended.  Comments:  Patient is instructed to take all prescribed medications as recommended. Report any side effects or adverse reactions to your outpatient psychiatrist. Patient is instructed to abstain from alcohol and illegal drugs while on prescription medications. In the event of worsening symptoms, patient is instructed to call the crisis hotline, 911, or go to the nearest emergency department for evaluation and  treatment.  Signed: Lauro Franklin, MD 11/19/2020, 2:10 PM

## 2020-11-19 NOTE — BHH Counselor (Signed)
Patient did not attend group.  Spiritual care group on grief and loss facilitated by chaplain Katy Earnest Thalman, BCC   Group Goal:   Support / Education around grief and loss   Members engage in facilitated group support and psycho-social education.   Group Description:   Following introductions and group rules, group members engaged in facilitated group dialog and support around topic of loss, with particular support around experiences of loss in their lives. Group Identified types of loss (relationships / self / things) and identified patterns, circumstances, and changes that precipitate losses. Reflected on thoughts / feelings around loss, normalized grief responses, and recognized variety in grief experience. Group noted Worden's four tasks of grief in discussion.   Group drew on Adlerian / Rogerian, narrative, MI,   Patient Progress:  

## 2020-11-19 NOTE — Progress Notes (Signed)
  [facility] Adult Case Management Discharge Plan :  Will you be returning to the same living situation after discharge:  Yes,  Home  At discharge, do you have transportation home?: Yes,  Mother  Do you have the ability to pay for your medications: Yes,  Medicaid   Release of information consent forms completed and in the chart;  Patient's signature needed at discharge.  Patient to Follow up at:  Follow-up Information     [facility] Follow up.   Specialty: Behavioral Health Why: Please go to this provider for outpatient therapy and medication management services during walk in hours:  Monday through Wednesday from 7:45 am to 11:00 am.  Services are provided on a first come, first served basis (please arrive early).  These services will be unavailabe from 11/19/20 to 11/23/20. Contact information: [address] [place] [address] [phone]        [facility] Follow up on 12/04/2020.   Why: You have a primary care appointment on 12/04/2020 at 2:30pm.  This doctor will refer you to an OB/GYN for your pregnancy care. Contact information: [address] [place] [address] [phone]                Next level of care provider has access to [facility]:yes  Safety Planning and Suicide Prevention discussed: Yes,  with patient and mother      Has patient been referred to the Quitline?: N/A patient is not a smoker  Patient has been referred for addiction treatment: N/A  Aram Beecham, LCSWA 11/19/2020, 10:15 AM

## 2020-11-19 NOTE — BH IP Treatment Plan (Signed)
Interdisciplinary Treatment and Diagnostic Plan Update  11/19/2020 Time of Session: 2:15pm Samantha Blair MRN: [id number]  Principal Diagnosis: MDD (major depressive disorder), recurrent episode, severe (HCC)  Secondary Diagnoses: Principal Problem:   MDD (major depressive disorder), recurrent episode, severe (HCC) Active Problems:   MDD (major depressive disorder)   Vitamin D deficiency   Current Medications:  Current Facility-Administered Medications  Medication Dose Route Frequency Provider Last Rate Last Admin   acetaminophen (TYLENOL) tablet 650 mg  650 mg Oral Q6H PRN Lenard Lance, FNP       diphenhydrAMINE (BENADRYL) capsule 25 mg  25 mg Oral QHS PRN Roselle Locus, MD   25 mg at 11/18/20 2241   melatonin tablet 5 mg  5 mg Oral QHS Hill, Shelbie Hutching, MD   5 mg at 11/18/20 2053   Vitamin D (Ergocalciferol) (DRISDOL) capsule 50,000 Units  50,000 Units Oral Q7 days Roselle Locus, MD   50,000 Units at 11/19/20 1026   PTA Medications: Medications Prior to Admission  Medication Sig Dispense Refill Last Dose   dicyclomine (BENTYL) 20 MG tablet Take 1 tablet (20 mg total) by mouth 2 (two) times daily as needed (abdominal cramping). (Patient not taking: Reported on 11/15/2020) 20 tablet 0    docusate sodium (COLACE) 100 MG capsule Take 1 capsule (100 mg total) by mouth 2 (two) times daily. (Patient not taking: Reported on 11/15/2020) 30 capsule 0    ferrous sulfate 325 (65 FE) MG tablet Take 1 tablet (325 mg total) by mouth 2 (two) times daily with a meal. (Patient not taking: Reported on 11/15/2020) 30 tablet 0    ibuprofen (ADVIL,MOTRIN) 600 MG tablet Take 1 tablet (600 mg total) by mouth every 6 (six) hours. (Patient not taking: Reported on 11/15/2020) 30 tablet 0    ondansetron (ZOFRAN ODT) 4 MG disintegrating tablet Take 1 tablet (4 mg total) by mouth every 8 (eight) hours as needed for nausea or vomiting. (Patient not taking: Reported on 11/15/2020) 20 tablet 0     Prenatal Vit-Fe Fumarate-FA (PNV PRENATAL PLUS MULTIVITAMIN) 27-1 MG TABS Take 1 tablet by mouth daily.  (Patient not taking: Reported on 11/15/2020)  6     Patient Stressors: Financial difficulties   Loss of significant other   Traumatic event    Patient Strengths: Average or above average intelligence  Capable of independent living  Motivation for treatment/growth  Supportive family/friends   Treatment Modalities: Medication Management, Group therapy, Case management,  1 to 1 session with clinician, Psychoeducation, Recreational therapy.   Physician Treatment Plan for Primary Diagnosis: MDD (major depressive disorder), recurrent episode, severe (HCC) Long Term Goal(s): Improvement in symptoms so as ready for discharge   Short Term Goals: Ability to identify changes in lifestyle to reduce recurrence of condition will improve Ability to verbalize feelings will improve Ability to disclose and discuss suicidal ideas Ability to demonstrate self-control will improve Ability to identify and develop effective coping behaviors will improve Ability to maintain clinical measurements within normal limits will improve Compliance with prescribed medications will improve Ability to identify triggers associated with substance abuse/mental health issues will improve  Medication Management: Evaluate patient's response, side effects, and tolerance of medication regimen.  Therapeutic Interventions: 1 to 1 sessions, Unit Group sessions and Medication administration.  Evaluation of Outcomes: Adequate for Discharge  Physician Treatment Plan for Secondary Diagnosis: Principal Problem:   MDD (major depressive disorder), recurrent episode, severe (HCC) Active Problems:   MDD (major depressive disorder)   Vitamin D deficiency  Long Term Goal(s): Improvement in symptoms so as ready for discharge   Short Term Goals: Ability to identify changes in lifestyle to reduce recurrence of condition will  improve Ability to verbalize feelings will improve Ability to disclose and discuss suicidal ideas Ability to demonstrate self-control will improve Ability to identify and develop effective coping behaviors will improve Ability to maintain clinical measurements within normal limits will improve Compliance with prescribed medications will improve Ability to identify triggers associated with substance abuse/mental health issues will improve     Medication Management: Evaluate patient's response, side effects, and tolerance of medication regimen.  Therapeutic Interventions: 1 to 1 sessions, Unit Group sessions and Medication administration.  Evaluation of Outcomes: Adequate for Discharge   RN Treatment Plan for Primary Diagnosis: MDD (major depressive disorder), recurrent episode, severe (HCC) Long Term Goal(s): Knowledge of disease and therapeutic regimen to maintain health will improve  Short Term Goals: Ability to remain free from injury will improve, Ability to verbalize frustration and anger appropriately will improve, Ability to identify and develop effective coping behaviors will improve, and Compliance with prescribed medications will improve  Medication Management: RN will administer medications as ordered by provider, will assess and evaluate patient's response and provide education to patient for prescribed medication. RN will report any adverse and/or side effects to prescribing provider.  Therapeutic Interventions: 1 on 1 counseling sessions, Psychoeducation, Medication administration, Evaluate responses to treatment, Monitor vital signs and CBGs as ordered, Perform/monitor CIWA, COWS, AIMS and Fall Risk screenings as ordered, Perform wound care treatments as ordered.  Evaluation of Outcomes: Adequate for Discharge   LCSW Treatment Plan for Primary Diagnosis: MDD (major depressive disorder), recurrent episode, severe (HCC) Long Term Goal(s): Safe transition to appropriate next  level of care at discharge, Engage patient in therapeutic group addressing interpersonal concerns.  Short Term Goals: Engage patient in aftercare planning with referrals and resources, Increase social support, Increase ability to appropriately verbalize feelings, Identify triggers associated with mental health/substance abuse issues, and Increase skills for wellness and recovery  Therapeutic Interventions: Assess for all discharge needs, 1 to 1 time with Social worker, Explore available resources and support systems, Assess for adequacy in community support network, Educate family and significant other(s) on suicide prevention, Complete Psychosocial Assessment, Interpersonal group therapy.  Evaluation of Outcomes: Adequate for Discharge   Progress in Treatment: Attending groups: Yes. Participating in groups: Yes. Taking medication as prescribed: Yes. Toleration medication: Yes. Family/Significant other contact made: Yes, individual(s) contacted:  mother Patient understands diagnosis: Yes. Discussing patient identified problems/goals with staff: Yes. Medical problems stabilized or resolved: Yes. Denies suicidal/homicidal ideation: Yes. Issues/concerns per patient self-inventory: No.   New problem(s) identified: No, Describe:  none  New Short Term/Long Term Goal(s): medication stabilization, elimination of SI thoughts, development of comprehensive mental wellness plan.    Patient Goals:  Did not attend  Discharge Plan or Barriers: Pt  is to stay with mother at discharge and is to follow up with [facility] for services.   Reason for Continuation of Hospitalization: Medication stabilization  Estimated Length of Stay: Adequate for discharge   Scribe for Treatment Team: Otelia Santee, LCSW 11/19/2020 3:01 PM

## 2020-11-19 NOTE — BHH Suicide Risk Assessment (Signed)
[facility] Discharge Suicide Risk Assessment   Principal Problem: MDD (major depressive disorder), recurrent episode, severe (HCC) Discharge Diagnoses: Principal Problem:   MDD (major depressive disorder), recurrent episode, severe (HCC) Active Problems:   MDD (major depressive disorder)   Vitamin D deficiency   Total Time spent with patient: 30 minutes  Musculoskeletal: Strength & Muscle Tone: within normal limits Gait & Station: normal Patient leans: N/A  Psychiatric Specialty Exam  Presentation  General Appearance: Appropriate for Environment; Fairly Groomed; Neat  Eye Contact:Good  Speech:Normal Rate; Clear and Coherent  Speech Volume:Normal  Handedness:Right   Mood and Affect  Mood:Depressed  Duration of Depression Symptoms: Greater than two weeks  Affect:Congruent   Thought Process  Thought Processes:Coherent  Descriptions of Associations:Intact  Orientation:Full (Time, Place and Person)  Thought Content:Logical  History of Schizophrenia/Schizoaffective disorder:No  Duration of Psychotic Symptoms:No data recorded Hallucinations:Hallucinations: None  Ideas of Reference:None  Suicidal Thoughts:Suicidal Thoughts: No  Homicidal Thoughts:Homicidal Thoughts: No   Sensorium  Memory:Immediate Good; Recent Good; Remote Good  Judgment:Good  Insight:Good   Executive Functions  Concentration:Good  Attention Span:Good  Recall:Good  Fund of Knowledge:Good  Language:Good   Psychomotor Activity  Psychomotor Activity:Psychomotor Activity: Normal   Assets  Assets:Communication Skills; Desire for Improvement; Resilience; Financial Resources/Insurance; Housing; Physical Health   Sleep  Sleep:Sleep: Fair Number of Hours of Sleep: 5   Physical Exam: Physical Exam Vitals and nursing note reviewed.  HENT:     Head: Normocephalic and atraumatic.     Nose: Nose normal.  Eyes:     Extraocular Movements: Extraocular movements intact.   Pulmonary:     Effort: Pulmonary effort is normal.  Musculoskeletal:        General: Normal range of motion.     Cervical back: Normal range of motion.  Neurological:     General: No focal deficit present.     Mental Status: She is alert.  Psychiatric:        Attention and Perception: Attention normal.        Mood and Affect: Mood is depressed.        Speech: Speech normal.        Behavior: Behavior normal. Behavior is cooperative.        Thought Content: Thought content normal. Thought content is not paranoid or delusional. Thought content does not include homicidal or suicidal ideation.        Cognition and Memory: Cognition normal. Cognition is not impaired. Memory is not impaired. She does not exhibit impaired recent memory or impaired remote memory.        Judgment: Judgment normal.   Review of Systems  Constitutional:  Positive for malaise/fatigue. Negative for fever.  Eyes:  Negative for blurred vision.  Respiratory:  Negative for cough.   Cardiovascular:  Negative for chest pain.  Gastrointestinal:  Positive for nausea. Negative for abdominal pain, constipation and diarrhea.  Genitourinary:  Negative for dysuria.  Musculoskeletal:  Negative for myalgias.  Skin:  Negative for rash.  Neurological:  Negative for dizziness and headaches.  Psychiatric/Behavioral:  Positive for depression. Negative for hallucinations, substance abuse and suicidal ideas. The patient has insomnia. The patient is not nervous/anxious.   Blood pressure 104/81, pulse 87, temperature 98.7 F (37.1 C), temperature source Oral, resp. rate 16, height 5\' 5"  (1.651 m), weight 72.6 kg, SpO2 100 %, unknown if currently breastfeeding. Body mass index is 26.63 kg/m.  Mental Status Per Nursing Assessment::   On Admission:  Self-harm thoughts  Demographic Factors:  Low socioeconomic  status and recent trauma  Loss Factors: Loss of significant relationship and suicide by boyfriend  Historical Factors: Prior  suicide attempts  Risk Reduction Factors:   Pregnancy, Responsible for children under 10 years of age, Sense of responsibility to family, Living with another person, especially a relative, and Positive social support  Continued Clinical Symptoms:  Dysthymia  Cognitive Features That Contribute To Risk:  None    Suicide Risk:  Mild:  Suicidal ideation of limited frequency, intensity, duration, and specificity.  There are no identifiable plans, no associated intent, mild dysphoria and related symptoms, good self-control (both objective and subjective assessment), few other risk factors, and identifiable protective factors, including available and accessible social support.   Follow-up Information     [facility] Follow up.   Specialty: Behavioral Health Why: Please go to this provider for outpatient therapy and medication management services during walk in hours:  Monday through Wednesday from 7:45 am to 11:00 am.  Services are provided on a first come, first served basis (please arrive early).  These services will be unavailabe from 11/19/20 to 11/23/20. Contact information: [address] [place] [address] [phone]         [facility] Follow up on 12/04/2020.   Why: You have a primary care appointment on 12/04/2020 at 2:30pm.  This doctor will refer you to an OB/GYN for your pregnancy care. Contact information: [address] [place] [address] [phone]                Plan Of Care/Follow-up recommendations:  Activity:  as tolerated Diet:  regular Follow up with OBGYN for pregnancy Follow up with therapy and psychiatry for depression. Deferred medications until after 1st trimester. May consider screening for TMS as an outpatient, if available.   Prescriptions for new medications provided for the patient to bridge to follow up appointment. The patient was informed that refills  for these prescriptions are generally not provided, and patient is encouraged to attend all follow up appointments to address medication refills and adjustments.   Today's discharge was reviewed with treatment team, and the team is in agreement that the patient is ready for discharge. The patient is was of the discharge plan for today and has been given opportunity to ask questions. At time of discharge, the patient does not vocalize any acute harm to self or others, is goal directed, able to advocate for self and organizational baseline.   At discharge, the patient is instructed to:  Take all medications as prescribed. Report any adverse effects and or reactions from the medicines to her outpatient provider promptly.  Do not engage in alcohol and/or illegal drug use while on prescription medicines.  In the event of worsening symptoms, patient is instructed to call the crisis hotline, 911 and or go to the nearest ED for appropriate evaluation and treatment of symptoms.  Follow-up with primary care provider for further care of medical issues, concerns and or health care needs.   Roselle Locus, MD 11/19/2020, 10:16 AM

## 2020-12-04 ENCOUNTER — Inpatient Hospital Stay: Payer: Medicaid Other | Admitting: Family Medicine

## 2020-12-19 ENCOUNTER — Telehealth: Payer: Self-pay | Admitting: *Deleted

## 2020-12-19 NOTE — Telephone Encounter (Signed)
Patient called because she received a missed call from automated system for appointment reminder. Patient given all appointment information for scheduled appointment on 12/24/2020 at 9:10 AM with an 8:55 AM arrival time.

## 2020-12-24 ENCOUNTER — Encounter: Payer: Medicaid Other | Admitting: Obstetrics & Gynecology

## 2021-01-27 NOTE — L&D Delivery Note (Signed)
OB/GYN Faculty Practice Delivery Note  Paxton Pamplona is a 26 y.o. [id number] s/p VBAC at [redacted]w[redacted]d. She was admitted for TOLAC/IOL for gHTN.   ROM: 0h 60m with clear fluid GBS Status: negative Maximum Maternal Temperature: 98.7  Labor Progress: Presented for IOL, received foley balloon and pitocin. Was then [facility] with clear fluid and soon after progressed to complete  Delivery Date/Time: 2339 on 07/07/2021 Delivery: Called to room and patient was complete and pushing. Head delivered ROA with compound hand. No nuchal cord present. Shoulder and body delivered in usual fashion. Infant with spontaneous cry, placed on mother's abdomen, dried and stimulated. Cord clamped x 2 after 1-minute delay, and cut by bedside RN by patient request. Cord blood drawn. Placenta delivered spontaneously with gentle cord traction. Fundus firm with massage and Pitocin. Labia, perineum, vagina, and cervix inspected and found to be intact. She was given TXA at delivery prophylactically given her low starting HgB (7.5) and strong desire to not get blood products.  Patient was seen to have a medium sized clot that expelled prior to placental delivery and a clot was seen in the placenta and therefore placenta was sent to pathology.  Additionally, patient also had a trickle that was suspected to be from a lower uterine segment clot/atony. Given the above mentioned low blood count and her wishes to not get blood products she was given a dose of IM Methergine (at time BP was normotensive at 120s/70s). It was also recommended to patient that a lower uterine segment sweep could be beneficial especially if there were clots that could be removed. Patient initially hesitant but later agreed and a sweep was done and an orange sized clot was removed. No further trickle noted after clot removed. Fundus remained firm throughout entire immediate post partum period.  Placenta: intact, clot noted along with lage clot that came out before placenta  - sent to pathology Complications: see above Lacerations: intact EBL: 300cc Analgesia: IV fentanyl  Infant: female  APGARs 9,9  weight 3310gm  Renard Matter, MD, MPH OB Fellow, [place] for [employer], [place]

## 2021-02-04 ENCOUNTER — Other Ambulatory Visit (INDEPENDENT_AMBULATORY_CARE_PROVIDER_SITE_OTHER): Payer: Medicaid Other

## 2021-02-04 ENCOUNTER — Other Ambulatory Visit: Payer: Self-pay

## 2021-02-04 VITALS — BP 122/77 | HR 68

## 2021-02-04 DIAGNOSIS — N912 Amenorrhea, unspecified: Secondary | ICD-10-CM

## 2021-02-04 DIAGNOSIS — Z3201 Encounter for pregnancy test, result positive: Secondary | ICD-10-CM | POA: Diagnosis not present

## 2021-02-04 DIAGNOSIS — Z3402 Encounter for supervision of normal first pregnancy, second trimester: Secondary | ICD-10-CM

## 2021-02-04 LAB — POCT URINE PREGNANCY: Preg Test, Ur: POSITIVE — AB

## 2021-02-04 MED ORDER — PREPLUS 27-1 MG PO TABS: 1.0000 | ORAL_TABLET | Freq: Every day | ORAL | 13 refills | Status: DC

## 2021-02-04 MED ORDER — PROMETHAZINE HCL 25 MG PO TABS: 25.0000 mg | ORAL_TABLET | Freq: Four times a day (QID) | ORAL | 1 refills | Status: DC | PRN

## 2021-02-04 NOTE — Progress Notes (Signed)
Samantha Blair presents today for UPT. She has no unusual complaints. LMP: 10/21/20    OBJECTIVE: Appears well, in no apparent distress.  OB History     Gravida  4   Para  2   Term  1   Preterm  1   AB  1   Living  3      SAB      IAB  1   Ectopic      Multiple  1   Live Births  3          Home UPT Result: NA In-Office UPT result: Positive I have reviewed the patient's medical, obstetrical, social, and family histories, and medications.   ASSESSMENT: Positive pregnancy test  PLAN Prenatal care to be completed at:   [facility] Needs US/ Scheduled by [employer]

## 2021-02-21 ENCOUNTER — Other Ambulatory Visit: Payer: Self-pay | Admitting: *Deleted

## 2021-02-21 DIAGNOSIS — Z3480 Encounter for supervision of other normal pregnancy, unspecified trimester: Secondary | ICD-10-CM

## 2021-02-26 ENCOUNTER — Other Ambulatory Visit: Payer: Self-pay

## 2021-02-26 ENCOUNTER — Encounter: Payer: Medicaid Other | Admitting: Obstetrics & Gynecology

## 2021-02-26 DIAGNOSIS — Z3480 Encounter for supervision of other normal pregnancy, unspecified trimester: Secondary | ICD-10-CM

## 2021-03-05 ENCOUNTER — Other Ambulatory Visit (HOSPITAL_COMMUNITY)
Admission: RE | Admit: 2021-03-05 | Discharge: 2021-03-05 | Disposition: A | Discharge status: Home / Self Care | Payer: Medicaid Other | Source: Ambulatory Visit | Attending: Obstetrics | Admitting: Obstetrics

## 2021-03-05 ENCOUNTER — Ambulatory Visit (INDEPENDENT_AMBULATORY_CARE_PROVIDER_SITE_OTHER): Payer: Medicaid Other | Admitting: Obstetrics & Gynecology

## 2021-03-05 ENCOUNTER — Other Ambulatory Visit: Payer: Self-pay

## 2021-03-05 ENCOUNTER — Encounter: Payer: Self-pay | Admitting: Obstetrics & Gynecology

## 2021-03-05 VITALS — BP 124/80 | HR 81 | Wt 169.0 lb

## 2021-03-05 DIAGNOSIS — E049 Nontoxic goiter, unspecified: Secondary | ICD-10-CM

## 2021-03-05 DIAGNOSIS — Z3480 Encounter for supervision of other normal pregnancy, unspecified trimester: Secondary | ICD-10-CM

## 2021-03-05 DIAGNOSIS — Z3A19 19 weeks gestation of pregnancy: Secondary | ICD-10-CM

## 2021-03-05 DIAGNOSIS — F329 Major depressive disorder, single episode, unspecified: Secondary | ICD-10-CM

## 2021-03-05 NOTE — Progress Notes (Signed)
°  Subjective:    Samantha Blair is a [id number] [redacted]w[redacted]d being seen today for her first obstetrical visit.  Her obstetrical history is significant for  history of cesarean section for twins . Patient does intend to breast feed. Pregnancy history fully reviewed.  Patient reports no complaints.  Vitals:   03/05/21 1138  BP: 124/80  Pulse: 81  Weight: 169 lb (76.7 kg)    HISTORY: OB History  Gravida Para Term Preterm AB Living  5 2 1 1 1 3   SAB IAB Ectopic Multiple Live Births    1   1 3     # Outcome Date GA Lbr Len/2nd Weight Sex Delivery Anes PTL Lv  5 Current           4 IAB 07/10/19     TAB     3 Term 12/08/15 [redacted]w[redacted]d 04:21 / 00:19 7 lb 6.9 oz (3.371 kg) F Vag-Spont None  LIV  2 Gravida           1A Preterm     F CS-LTranv   LIV  1B Preterm     F CS-LTranv   LIV   Past Medical History:  Diagnosis Date   Abortion    Goiter    left side of neck   Past Surgical History:  Procedure Laterality Date   CESAREAN SECTION     Family History  Problem Relation Age of Onset   Cancer Maternal Grandmother    Cancer Maternal Grandfather      Exam    Uterus:   22 week size  Pelvic Exam:    Perineum: No Hemorrhoids   Vulva: normal   Vagina:  White discharge   pH:    Cervix: no lesions   Adnexa: normal adnexa   Bony Pelvis: average  System: Breast:  normal appearance, no masses or tenderness   Skin: normal coloration and turgor, no rashes    Neurologic: oriented, normal mood   Extremities: normal strength, tone, and muscle mass   HEENT PERRLA   Mouth/Teeth mucous membranes moist, pharynx normal without lesions and dental hygiene good   Neck supple and palpable symmetrical thyroid   Cardiovascular: regular rate and rhythm, no murmurs or gallops   Respiratory:  appears well, vitals normal, no respiratory distress, acyanotic, normal RR, chest clear, no wheezing, crepitations, rhonchi, normal symmetric air entry   Abdomen: Gravid S>D   Urinary: urethral meatus normal       Assessment:    Pregnancy: 13/11/17 Patient Active Problem List   Diagnosis Date Noted   Supervision of other normal pregnancy, antepartum 02/26/2021   Vitamin D deficiency 11/18/2020   MDD (major depressive disorder), recurrent episode, severe (HCC) 11/16/2020   MDD (major depressive disorder) 11/16/2020   VBAC (vaginal birth after Cesarean) 12/09/2015   Normal labor 12/08/2015   Thyroid enlarged 10/25/2015        Plan:     Initial labs drawn. Prenatal vitamins. Problem list reviewed and updated. Genetic Screening discussed : ordered.  Ultrasound discussed; fetal survey: ordered.  Follow up in 4 weeks. 50% of 30 min visit spent on counseling and coordination of care.  Behavioral health referral   13/11/2015 03/05/2021

## 2021-03-05 NOTE — Progress Notes (Signed)
PHQ9 score 17 today - Referral has been placed.

## 2021-03-06 LAB — CBC/D/PLT+RPR+RH+ABO+RUBIGG...
Antibody Screen: NEGATIVE
Basophils Absolute: 0 10*3/uL (ref 0.0–0.2)
Basos: 0 %
EOS (ABSOLUTE): 0.2 10*3/uL (ref 0.0–0.4)
Eos: 2 %
HCV Ab: <0.1 s/co ratio (ref 0.0–0.9)
HIV Screen 4th Generation wRfx: NONREACTIVE
Hematocrit: 29.2 % — ABNORMAL LOW (ref 34.0–46.6)
Hemoglobin: 9.4 g/dL — ABNORMAL LOW (ref 11.1–15.9)
Hepatitis B Surface Ag: NEGATIVE
Immature Grans (Abs): 0 10*3/uL (ref 0.0–0.1)
Immature Granulocytes: 0 %
Lymphocytes Absolute: 1.4 10*3/uL (ref 0.7–3.1)
Lymphs: 15 %
MCH: 25.5 pg — ABNORMAL LOW (ref 26.6–33.0)
MCHC: 32.2 g/dL (ref 31.5–35.7)
MCV: 79 fL (ref 79–97)
Monocytes Absolute: 0.6 10*3/uL (ref 0.1–0.9)
Monocytes: 6 %
Neutrophils Absolute: 6.9 10*3/uL (ref 1.4–7.0)
Neutrophils: 77 %
Platelets: 278 10*3/uL (ref 150–450)
RBC: 3.69 x10E6/uL — ABNORMAL LOW (ref 3.77–5.28)
RDW: 14.9 % (ref 11.7–15.4)
RPR Ser Ql: NONREACTIVE
Rh Factor: POSITIVE
Rubella Antibodies, IGG: 11.2 index (ref >0.99)
WBC: 9.1 10*3/uL (ref 3.4–10.8)

## 2021-03-06 LAB — CERVICOVAGINAL ANCILLARY ONLY
Bacterial Vaginitis (gardnerella): NEGATIVE
Candida Glabrata: NEGATIVE
Candida Vaginitis: NEGATIVE
Chlamydia: NEGATIVE
Comment: NEGATIVE
Comment: NEGATIVE
Comment: NEGATIVE
Comment: NEGATIVE
Comment: NEGATIVE
Comment: NORMAL
Neisseria Gonorrhea: NEGATIVE
Trichomonas: NEGATIVE

## 2021-03-06 LAB — TSH: TSH: 0.969 u[IU]/mL (ref 0.450–4.500)

## 2021-03-06 LAB — HCV INTERPRETATION

## 2021-03-06 LAB — CYTOLOGY - PAP: Diagnosis: NEGATIVE

## 2021-03-07 ENCOUNTER — Other Ambulatory Visit: Payer: Self-pay | Admitting: *Deleted

## 2021-03-07 DIAGNOSIS — Z3480 Encounter for supervision of other normal pregnancy, unspecified trimester: Secondary | ICD-10-CM

## 2021-03-07 LAB — URINE CULTURE, OB REFLEX: Organism ID, Bacteria: NO GROWTH

## 2021-03-07 LAB — CULTURE, OB URINE

## 2021-03-09 ENCOUNTER — Encounter: Payer: Self-pay | Admitting: Obstetrics & Gynecology

## 2021-03-09 LAB — AFP, SERUM, OPEN SPINA BIFIDA
AFP MoM: 1.08
AFP Value: 59.4 ng/mL
Gest. Age on Collection Date: 19.3 weeks
Maternal Age At EDD: 27.6 yr
OSBR Risk 1 IN: 10000
Test Results:: NEGATIVE
Weight: 169 [lb_av]

## 2021-03-13 ENCOUNTER — Other Ambulatory Visit: Payer: Self-pay | Admitting: Obstetrics

## 2021-03-13 ENCOUNTER — Other Ambulatory Visit: Payer: Self-pay

## 2021-03-13 ENCOUNTER — Ambulatory Visit: Payer: Medicaid Other | Attending: Obstetrics

## 2021-03-13 ENCOUNTER — Ambulatory Visit (HOSPITAL_BASED_OUTPATIENT_CLINIC_OR_DEPARTMENT_OTHER): Payer: Medicaid Other | Admitting: Obstetrics

## 2021-03-13 ENCOUNTER — Ambulatory Visit: Payer: Medicaid Other | Admitting: *Deleted

## 2021-03-13 VITALS — BP 123/78 | HR 70

## 2021-03-13 DIAGNOSIS — Z3A20 20 weeks gestation of pregnancy: Secondary | ICD-10-CM | POA: Diagnosis not present

## 2021-03-13 DIAGNOSIS — Z3480 Encounter for supervision of other normal pregnancy, unspecified trimester: Secondary | ICD-10-CM | POA: Diagnosis not present

## 2021-03-13 DIAGNOSIS — O35EXX0 Maternal care for other (suspected) fetal abnormality and damage, fetal genitourinary anomalies, not applicable or unspecified: Secondary | ICD-10-CM | POA: Diagnosis not present

## 2021-03-13 DIAGNOSIS — O0932 Supervision of pregnancy with insufficient antenatal care, second trimester: Secondary | ICD-10-CM | POA: Insufficient documentation

## 2021-03-13 DIAGNOSIS — O34219 Maternal care for unspecified type scar from previous cesarean delivery: Secondary | ICD-10-CM | POA: Insufficient documentation

## 2021-03-13 DIAGNOSIS — Z363 Encounter for antenatal screening for malformations: Secondary | ICD-10-CM | POA: Insufficient documentation

## 2021-03-13 DIAGNOSIS — O35DXX0 Maternal care for other (suspected) fetal abnormality and damage, fetal gastrointestinal anomalies, not applicable or unspecified: Secondary | ICD-10-CM | POA: Insufficient documentation

## 2021-03-13 NOTE — Progress Notes (Signed)
MFM Note  Samantha Blair was seen for a detailed fetal anatomy scan.   She denies any significant past medical history and denies any problems in her current pregnancy.    The results of her cell free DNA test are currently pending.  She just had it drawn about 6 days ago.  She was informed that the fetal growth and amniotic fluid level were appropriate for her gestational age.   On today's exam, an intracardiac echogenic focus was noted in the left ventricle of the fetal heart.  The small association between an echogenic focus and Down syndrome was discussed.   Right pyelectasis measuring 0.4-0.5 cm dilated was noted on today's ultrasound exam.  The implications and management of pylectasiswas discussed with the patient. She was advised that the pyelectasis noted on prenatal ultrasounds will often resolve spontaneously after birth.    However, there may also be other processes such as an obstruction or reflux causing this finding that may require treatment after birth.  She was advised that we will continue to follow her closely to assess this finding.    Due to the echogenic focus and pyelectasis noted today, the patient was offered and declined an amniocentesis today for definitive diagnosis of fetal aneuploidy.  She we will await the results of her cell free DNA test.  The patient was informed that anomalies may be missed due to technical limitations. If the fetus is in a suboptimal position or maternal habitus is increased, visualization of the fetus in the maternal uterus may be impaired.  A follow-up exam was scheduled in 4 weeks for follow-up of the pyelectasis noted today.    The patient stated that all of her questions were answered.  A total of 30 minutes was spent counseling and coordinating the care for this patient.  Greater than 50% of the time was spent in direct face-to-face contact.

## 2021-03-14 ENCOUNTER — Other Ambulatory Visit: Payer: Self-pay | Admitting: *Deleted

## 2021-03-14 ENCOUNTER — Encounter: Payer: Self-pay | Admitting: Obstetrics & Gynecology

## 2021-03-14 DIAGNOSIS — O35EXX0 Maternal care for other (suspected) fetal abnormality and damage, fetal genitourinary anomalies, not applicable or unspecified: Secondary | ICD-10-CM

## 2021-03-20 ENCOUNTER — Encounter: Payer: Self-pay | Admitting: Obstetrics & Gynecology

## 2021-03-20 DIAGNOSIS — Z3480 Encounter for supervision of other normal pregnancy, unspecified trimester: Secondary | ICD-10-CM

## 2021-04-02 ENCOUNTER — Encounter: Payer: Self-pay | Admitting: Obstetrics

## 2021-04-02 ENCOUNTER — Ambulatory Visit (INDEPENDENT_AMBULATORY_CARE_PROVIDER_SITE_OTHER): Payer: Medicaid Other | Admitting: Obstetrics

## 2021-04-02 ENCOUNTER — Ambulatory Visit (INDEPENDENT_AMBULATORY_CARE_PROVIDER_SITE_OTHER): Payer: Medicaid Other | Admitting: Licensed Clinical Social Worker

## 2021-04-02 ENCOUNTER — Other Ambulatory Visit: Payer: Self-pay

## 2021-04-02 VITALS — BP 118/69 | HR 73 | Wt 173.0 lb

## 2021-04-02 DIAGNOSIS — F329 Major depressive disorder, single episode, unspecified: Secondary | ICD-10-CM | POA: Diagnosis not present

## 2021-04-02 DIAGNOSIS — Z3480 Encounter for supervision of other normal pregnancy, unspecified trimester: Secondary | ICD-10-CM

## 2021-04-02 DIAGNOSIS — O99019 Anemia complicating pregnancy, unspecified trimester: Secondary | ICD-10-CM

## 2021-04-02 MED ORDER — IRON POLYSACCH CMPLX-B12-FA 150-0.025-1 MG PO CAPS: 1.0000 | ORAL_CAPSULE | ORAL | 5 refills | Status: DC

## 2021-04-02 NOTE — Progress Notes (Signed)
Subjective:  ?Samantha Blair is a 26 y.o. [phone] at [redacted]w[redacted]d being seen today for ongoing prenatal care.  She is currently monitored for the following issues for this low-risk pregnancy and has Thyroid enlarged; Normal labor; VBAC (vaginal birth after Cesarean); MDD (major depressive disorder), recurrent episode, severe ([place]); MDD (major depressive disorder); Vitamin D deficiency; and Supervision of other normal pregnancy, antepartum on their problem list. ? ?Patient reports backache.  Contractions: Not present. Vag. Bleeding: None.  Movement: Present. Denies leaking of fluid.  ? ?The following portions of the patient's history were reviewed and updated as appropriate: allergies, current medications, past family history, past medical history, past social history, past surgical history and problem list. Problem list updated. ? ?Objective:  ? ?Vitals:  ? 04/02/21 1131  ?BP: 118/69  ?Pulse: 73  ?Weight: 173 lb (78.5 kg)  ? ? ?Fetal Status:     Movement: Present    ? ?General:  Alert, oriented and cooperative. Patient is in no acute distress.  ?Skin: Skin is warm and dry. No rash noted.   ?Cardiovascular: Normal heart rate noted  ?Respiratory: Normal respiratory effort, no problems with respiration noted  ?Abdomen: Soft, gravid, appropriate for gestational age. Pain/Pressure: Absent     ?Pelvic:  Cervical exam deferred        ?Extremities: Normal range of motion.     ?Mental Status: Normal mood and affect. Normal behavior. Normal judgment and thought content.  ? ?Urinalysis:     ? ?Assessment and Plan:  ?Pregnancy: [id number] at [redacted]w[redacted]d ? ?1. Supervision of other normal pregnancy, antepartum ? ?2. Major depressive disorder with current active episode, unspecified depression episode severity, unspecified whether recurrent ?- clinically stable ? ?3. Anemia affecting pregnancy, antepartum ?Rx: ?- Iron Polysacch [id number]-FA 150-0.025-1 MG CAPS; Take 1 capsule by mouth every other day.  Dispense: 30 capsule; Refill: 5  ? ? ?There  are no diagnoses linked to this encounter. ?Preterm labor symptoms and general obstetric precautions including but not limited to vaginal bleeding, contractions, leaking of fluid and fetal movement were reviewed in detail with the patient. ?Please refer to After Visit Summary for other counseling recommendations.  ? ?Return in about 4 weeks (around 04/30/2021) for ROB, 2 hour OGTT. ? ? ?Shelly Bombard, MD  ?04/02/21  ?
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
## 2021-04-10 ENCOUNTER — Ambulatory Visit: Payer: Medicaid Other | Attending: Obstetrics

## 2021-04-10 ENCOUNTER — Ambulatory Visit: Payer: Self-pay

## 2021-04-30 ENCOUNTER — Encounter: Payer: Medicaid Other | Admitting: Obstetrics and Gynecology

## 2021-04-30 ENCOUNTER — Ambulatory Visit: Payer: Medicaid Other | Admitting: Licensed Clinical Social Worker

## 2021-04-30 ENCOUNTER — Other Ambulatory Visit: Payer: Medicaid Other

## 2021-05-10 ENCOUNTER — Ambulatory Visit: Payer: Medicaid Other | Admitting: Licensed Clinical Social Worker

## 2021-05-10 ENCOUNTER — Encounter: Payer: Medicaid Other | Admitting: Obstetrics and Gynecology

## 2021-05-10 ENCOUNTER — Other Ambulatory Visit: Payer: Medicaid Other

## 2021-05-10 DIAGNOSIS — Z3480 Encounter for supervision of other normal pregnancy, unspecified trimester: Secondary | ICD-10-CM

## 2021-05-20 ENCOUNTER — Other Ambulatory Visit: Payer: Medicaid Other

## 2021-05-20 ENCOUNTER — Ambulatory Visit (INDEPENDENT_AMBULATORY_CARE_PROVIDER_SITE_OTHER): Payer: Medicaid Other | Admitting: Obstetrics and Gynecology

## 2021-05-20 ENCOUNTER — Encounter: Payer: Self-pay | Admitting: Obstetrics and Gynecology

## 2021-05-20 VITALS — BP 130/80 | HR 80 | Wt 179.0 lb

## 2021-05-20 DIAGNOSIS — Z3A30 30 weeks gestation of pregnancy: Secondary | ICD-10-CM | POA: Diagnosis not present

## 2021-05-20 DIAGNOSIS — Z3480 Encounter for supervision of other normal pregnancy, unspecified trimester: Secondary | ICD-10-CM | POA: Diagnosis not present

## 2021-05-20 DIAGNOSIS — F329 Major depressive disorder, single episode, unspecified: Secondary | ICD-10-CM

## 2021-05-20 DIAGNOSIS — O34219 Maternal care for unspecified type scar from previous cesarean delivery: Secondary | ICD-10-CM

## 2021-05-20 DIAGNOSIS — Z3009 Encounter for other general counseling and advice on contraception: Secondary | ICD-10-CM

## 2021-05-20 NOTE — Addendum Note (Signed)
Addended by: Flonnie Hailstone on: 05/20/2021 10:56 AM ? ? Modules accepted: Orders ? ?

## 2021-05-20 NOTE — Progress Notes (Signed)
Pt presents for 2 gtt labs. ?She requests to discuss BTL.  ?Pt declined Tdap vaccine today. ?PHQ9= 15 ?GAD7= 15 Pt agrees to counseling referral; declines visit today.  FOB recently deceased  ?
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
## 2021-05-21 ENCOUNTER — Encounter: Payer: Medicaid Other | Admitting: Obstetrics and Gynecology

## 2021-05-21 ENCOUNTER — Other Ambulatory Visit: Payer: Medicaid Other

## 2021-05-21 ENCOUNTER — Telehealth: Payer: Self-pay

## 2021-05-21 LAB — RPR: RPR Ser Ql: NONREACTIVE

## 2021-05-21 LAB — HIV ANTIBODY (ROUTINE TESTING W REFLEX): HIV Screen 4th Generation wRfx: NONREACTIVE

## 2021-05-21 LAB — GLUCOSE TOLERANCE, 2 HOURS W/ 1HR
Glucose, 1 hour: 130 mg/dL (ref 70–179)
Glucose, 2 hour: 105 mg/dL (ref 70–152)
Glucose, Fasting: 80 mg/dL (ref 70–91)

## 2021-05-21 LAB — CBC
Hematocrit: 26.3 % — ABNORMAL LOW (ref 34.0–46.6)
Hemoglobin: 8.1 g/dL — ABNORMAL LOW (ref 11.1–15.9)
MCH: 22.4 pg — ABNORMAL LOW (ref 26.6–33.0)
MCHC: 30.8 g/dL — ABNORMAL LOW (ref 31.5–35.7)
MCV: 73 fL — ABNORMAL LOW (ref 79–97)
Platelets: 294 10*3/uL (ref 150–450)
RBC: 3.61 x10E6/uL — ABNORMAL LOW (ref 3.77–5.28)
RDW: 16.4 % — ABNORMAL HIGH (ref 11.7–15.4)
WBC: 9.2 10*3/uL (ref 3.4–10.8)

## 2021-05-21 NOTE — Telephone Encounter (Signed)
Attempted to contact about results, no answer, vm is full. 

## 2021-05-23 ENCOUNTER — Telehealth: Payer: Self-pay

## 2021-05-23 ENCOUNTER — Other Ambulatory Visit: Payer: Self-pay | Admitting: Obstetrics and Gynecology

## 2021-05-23 DIAGNOSIS — O99013 Anemia complicating pregnancy, third trimester: Secondary | ICD-10-CM | POA: Insufficient documentation

## 2021-05-23 NOTE — Telephone Encounter (Signed)
S/w pt and advised of labs. Pt states that she is not taking PO iron due to constipation, and she would like to proceed with the infusions, messaged provider. ?

## 2021-05-24 ENCOUNTER — Telehealth: Payer: Self-pay | Admitting: Pharmacy Technician

## 2021-05-24 NOTE — Telephone Encounter (Signed)
Dr. Para March, ?Fyi note: ? ?Auth Submission: no auth needed ?Payer: [place] MEDICAID ?Medication & CPT/J Code(s) submitted: Venofer (Iron Sucrose) J1756 ?Route of submission (phone, fax, portal): PHONE ?Auth type: Buy/Bill ?Units/visits requested: 5 ?Reference number:  ?Approval from: 05/24/21 to 07/24/21  ? ?Patient will be scheduled as soon as possible. ? ?Kim ?

## 2021-05-28 ENCOUNTER — Encounter: Payer: Self-pay | Admitting: Obstetrics and Gynecology

## 2021-06-03 ENCOUNTER — Telehealth: Payer: Self-pay

## 2021-06-03 NOTE — Telephone Encounter (Signed)
Called patient to schedule venofer, she says she no longer needing infusion. I will close referral. Thanks! ?

## 2021-06-04 ENCOUNTER — Encounter: Payer: Medicaid Other | Admitting: Obstetrics and Gynecology

## 2021-06-04 ENCOUNTER — Ambulatory Visit: Payer: Medicaid Other | Admitting: Licensed Clinical Social Worker

## 2021-06-12 ENCOUNTER — Ambulatory Visit: Payer: Medicaid Other | Admitting: Licensed Clinical Social Worker

## 2021-06-12 ENCOUNTER — Encounter: Payer: Medicaid Other | Admitting: Obstetrics

## 2021-06-13 NOTE — BH Specialist Note (Signed)
Pt no show

## 2021-07-02 ENCOUNTER — Encounter (HOSPITAL_COMMUNITY): Payer: Self-pay | Admitting: Obstetrics & Gynecology

## 2021-07-02 ENCOUNTER — Inpatient Hospital Stay (HOSPITAL_COMMUNITY)
Admission: AD | Admit: 2021-07-02 | Discharge: 2021-07-02 | Disposition: A | Discharge status: Home / Self Care | Payer: Medicaid Other | Attending: Obstetrics & Gynecology | Admitting: Obstetrics & Gynecology

## 2021-07-02 ENCOUNTER — Other Ambulatory Visit: Payer: Self-pay

## 2021-07-02 ENCOUNTER — Encounter: Payer: Self-pay | Admitting: Obstetrics

## 2021-07-02 ENCOUNTER — Ambulatory Visit (INDEPENDENT_AMBULATORY_CARE_PROVIDER_SITE_OTHER): Payer: Medicaid Other | Admitting: Obstetrics

## 2021-07-02 ENCOUNTER — Other Ambulatory Visit (HOSPITAL_COMMUNITY)
Admission: RE | Admit: 2021-07-02 | Discharge: 2021-07-02 | Disposition: A | Discharge status: Home / Self Care | Payer: Medicaid Other | Source: Ambulatory Visit | Attending: Obstetrics and Gynecology | Admitting: Obstetrics and Gynecology

## 2021-07-02 VITALS — BP 141/89 | HR 86 | Wt 186.9 lb

## 2021-07-02 DIAGNOSIS — O99019 Anemia complicating pregnancy, unspecified trimester: Secondary | ICD-10-CM

## 2021-07-02 DIAGNOSIS — Z3480 Encounter for supervision of other normal pregnancy, unspecified trimester: Secondary | ICD-10-CM

## 2021-07-02 DIAGNOSIS — Z3A36 36 weeks gestation of pregnancy: Secondary | ICD-10-CM | POA: Diagnosis not present

## 2021-07-02 DIAGNOSIS — R519 Headache, unspecified: Secondary | ICD-10-CM | POA: Diagnosis not present

## 2021-07-02 DIAGNOSIS — O99013 Anemia complicating pregnancy, third trimester: Secondary | ICD-10-CM | POA: Diagnosis not present

## 2021-07-02 DIAGNOSIS — O26893 Other specified pregnancy related conditions, third trimester: Secondary | ICD-10-CM | POA: Insufficient documentation

## 2021-07-02 DIAGNOSIS — D649 Anemia, unspecified: Secondary | ICD-10-CM | POA: Insufficient documentation

## 2021-07-02 DIAGNOSIS — N898 Other specified noninflammatory disorders of vagina: Secondary | ICD-10-CM | POA: Diagnosis not present

## 2021-07-02 DIAGNOSIS — O133 Gestational [pregnancy-induced] hypertension without significant proteinuria, third trimester: Secondary | ICD-10-CM | POA: Diagnosis not present

## 2021-07-02 DIAGNOSIS — R030 Elevated blood-pressure reading, without diagnosis of hypertension: Secondary | ICD-10-CM | POA: Diagnosis present

## 2021-07-02 DIAGNOSIS — O34219 Maternal care for unspecified type scar from previous cesarean delivery: Secondary | ICD-10-CM

## 2021-07-02 LAB — URINALYSIS, ROUTINE W REFLEX MICROSCOPIC
Bilirubin Urine: NEGATIVE
Glucose, UA: NEGATIVE mg/dL
Hgb urine dipstick: NEGATIVE
Ketones, ur: NEGATIVE mg/dL
Nitrite: NEGATIVE
Protein, ur: NEGATIVE mg/dL
Specific Gravity, Urine: 1.027 (ref 1.005–1.030)
pH: 6 (ref 5.0–8.0)

## 2021-07-02 LAB — COMPREHENSIVE METABOLIC PANEL
ALT: 9 U/L (ref 0–44)
AST: 14 U/L — ABNORMAL LOW (ref 15–41)
Albumin: 2.5 g/dL — ABNORMAL LOW (ref 3.5–5.0)
Alkaline Phosphatase: 86 U/L (ref 38–126)
Anion gap: 8 (ref 5–15)
BUN: 8 mg/dL (ref 6–20)
CO2: 21 mmol/L — ABNORMAL LOW (ref 22–32)
Calcium: 8.5 mg/dL — ABNORMAL LOW (ref 8.9–10.3)
Chloride: 108 mmol/L (ref 98–111)
Creatinine, Ser: 0.61 mg/dL (ref 0.44–1.00)
GFR, Estimated: >60 mL/min (ref >60)
Glucose, Bld: 88 mg/dL (ref 70–99)
Potassium: 3.4 mmol/L — ABNORMAL LOW (ref 3.5–5.1)
Sodium: 137 mmol/L (ref 135–145)
Total Bilirubin: 1 mg/dL (ref 0.3–1.2)
Total Protein: 6.1 g/dL — ABNORMAL LOW (ref 6.5–8.1)

## 2021-07-02 LAB — CBC
HCT: 24.9 % — ABNORMAL LOW (ref 36.0–46.0)
Hemoglobin: 7.3 g/dL — ABNORMAL LOW (ref 12.0–15.0)
MCH: 20.4 pg — ABNORMAL LOW (ref 26.0–34.0)
MCHC: 29.3 g/dL — ABNORMAL LOW (ref 30.0–36.0)
MCV: 69.6 fL — ABNORMAL LOW (ref 80.0–100.0)
Platelets: 268 10*3/uL (ref 150–400)
RBC: 3.58 MIL/uL — ABNORMAL LOW (ref 3.87–5.11)
RDW: 18.5 % — ABNORMAL HIGH (ref 11.5–15.5)
WBC: 7.2 10*3/uL (ref 4.0–10.5)
nRBC: 0.3 % — ABNORMAL HIGH (ref 0.0–0.2)

## 2021-07-02 LAB — PROTEIN / CREATININE RATIO, URINE
Creatinine, Urine: 228.4 mg/dL
Protein Creatinine Ratio: 0.09 mg/mg{Cre} (ref 0.00–0.15)
Total Protein, Urine: 21 mg/dL

## 2021-07-02 MED ORDER — SODIUM CHLORIDE 0.9 % IV SOLN
510.0000 mg | Freq: Once | INTRAVENOUS | Status: DC
  Filled 2021-07-02: qty 17

## 2021-07-02 NOTE — Progress Notes (Addendum)
Subjective:  Ellenora Bottino is a 26 y.o. [phone] at [redacted]w[redacted]d being seen today for ongoing prenatal care.  She is currently monitored for the following issues for this low-risk pregnancy and has VBAC (vaginal birth after Cesarean); MDD (major depressive disorder), recurrent episode, severe ([place]); MDD (major depressive disorder); Vitamin D deficiency; Supervision of other normal pregnancy, antepartum; and Anemia of pregnancy in third trimester on their problem list.  Patient reports headache and blurred vision .  Contractions: Not present. Vag. Bleeding: None.  Movement: Present. Denies leaking of fluid.   The following portions of the patient's history were reviewed and updated as appropriate: allergies, current medications, past family history, past medical history, past social history, past surgical history and problem list. Problem list updated.  Objective:   Vitals:   07/02/21 0948 07/02/21 0957  BP: 135/90 (!) 141/89  Pulse: 85 86  Weight: 186 lb 14.4 oz (84.8 kg)     Fetal Status:     Movement: Present     General:  Alert, oriented and cooperative. Patient is in no acute distress.  Skin: Skin is warm and dry. No rash noted.   Cardiovascular: Normal heart rate noted  Respiratory: Normal respiratory effort, no problems with respiration noted  Abdomen: Soft, gravid, appropriate for gestational age. Pain/Pressure: Present     Pelvic:  Cervical exam deferred        Extremities: Normal range of motion.  Edema: None  Mental Status: Normal mood and affect. Normal behavior. Normal judgment and thought content.   Urinalysis:      Assessment and Plan:  Pregnancy: [id number] at [redacted]w[redacted]d  1. Supervision of other normal pregnancy, antepartum Rx: - Strep Gp B Culture+Rflx - Cervicovaginal ancillary only( [place])  2. Previous cesarean delivery, antepartum - desires VBAC  3. VBAC (vaginal birth after Cesarean) - consent signed  4. Gestational hypertension, third trimester - patient sent  to maternity admissions for further evaluation for pre-eclampsia  5. Anemia affecting pregnancy, antepartum - iron transfusion ordered but patient refused    Preterm labor symptoms and general obstetric precautions including but not limited to vaginal bleeding, contractions, leaking of fluid and fetal movement were reviewed in detail with the patient. Please refer to After Visit Summary for other counseling recommendations.      Shelly Bombard, MD  07/02/21

## 2021-07-02 NOTE — MAU Provider Note (Cosign Needed Addendum)
History     CSN: [id number]  Arrival date and time: 07/02/21 1043   Event Date/Time   First Provider Initiated Contact with Patient 07/02/21 1155      Chief Complaint  Patient presents with   Hypertension   Blurred Vision   Samantha Blair is a 26 y.o. [phone] at [redacted]w[redacted]d who presents to the MAU from [facility] for elevated blood pressures. In office BPs were 135/90 and 141/89. Patient has never had elevated pressures during this pregnancy. She had a history of pre-eclampsia and gHTN during a prior pregnancy. She reports in the office she had a headache 4/10 that was located bitemporally and throbbing. Pain is now a 2/10. She denies visual changes today. For 2 months she had been waking up with visual blurriness and black spots in her vision. This visual disturbance quickly resolves. She denies RUQ pain, leg swelling, fever, and cough. She also complains of LUQ burning and tender pain x1-2 months. Pain is intermittent and worse with sitting up. She denies nausea/vomiting. Nothing makes it better or worse. Pain is not worse with meals. She has GERD and reports GERD pain is elsewhere.   OB History     Gravida  5   Para  2   Term  1   Preterm  1   AB  1   Living  3      SAB      IAB  1   Ectopic      Multiple  1   Live Births  3           Past Medical History:  Diagnosis Date   Abortion    Goiter    left side of neck    Past Surgical History:  Procedure Laterality Date   CESAREAN SECTION      Family History  Problem Relation Age of Onset   Cancer Maternal Grandmother    Cancer Maternal Grandfather     Social History   Tobacco Use   Smoking status: Former   Smokeless tobacco: Never  Vaping Use   Vaping Use: Former   Quit date: 05/31/2020  Substance Use Topics   Alcohol use: Not Currently   Drug use: No    Allergies:  Allergies  Allergen Reactions   Penicillins Hives    Has patient had a PCN reaction causing immediate rash, facial/tongue/throat  swelling, SOB or lightheadedness with hypotension: Yes Has patient had a PCN reaction causing severe rash involving mucus membranes or skin necrosis: Yes Has patient had a PCN reaction that required hospitalization Yes Has patient had a PCN reaction occurring within the last 10 years: No If all of the above answers are "NO", then may proceed with Cephalosporin use.     Medications Prior to Admission  Medication Sig Dispense Refill Last Dose   Prenatal Vit-Fe Fumarate-FA (PREPLUS) 27-1 MG TABS Take 1 tablet by mouth daily. 30 tablet 13 07/01/2021    Review of Systems  Constitutional:  Positive for fatigue. Negative for activity change, appetite change, chills and fever.  Eyes:  Positive for visual disturbance.  Respiratory:  Positive for shortness of breath.   Cardiovascular:  Negative for chest pain and palpitations.  Gastrointestinal:  Positive for abdominal pain. Negative for nausea and vomiting.  Neurological:  Positive for headaches. Negative for dizziness.  Physical Exam   Blood pressure 135/86, pulse 69, temperature 98.4 F (36.9 C), temperature source Oral, resp. rate 17, height 5\' 5"  (1.651 m), weight 84.6 kg, last menstrual period 10/21/2020,  SpO2 99 %, unknown if currently breastfeeding.  Physical Exam Vitals and nursing note reviewed. Exam conducted with a chaperone present.  HENT:     Head: Normocephalic.     Mouth/Throat:     Mouth: Mucous membranes are moist.  Eyes:     Pupils: Pupils are equal, round, and reactive to light.  Cardiovascular:     Rate and Rhythm: Normal rate.  Pulmonary:     Effort: Pulmonary effort is normal.  Abdominal:     Palpations: Abdomen is soft.     Tenderness: There is abdominal tenderness (in LUQ, but no tenderness in RUQ).  Genitourinary:    General: Normal vulva.  Musculoskeletal:        General: Normal range of motion.  Skin:    General: Skin is warm and dry.  Neurological:     General: No focal deficit present.     Mental  Status: She is alert and oriented to person, place, and time.  Psychiatric:        Mood and Affect: Mood normal.        Behavior: Behavior normal.    NST:  Baseline: 140 Variability: MODERATE Accels: 15X15 Decels: NONE Toco: NONE Reactive/Appropriate for GA  Patient Vitals for the past 24 hrs:  BP Temp Temp src Pulse Resp SpO2 Height Weight  07/02/21 1330 129/78 -- -- 77 -- 100 % -- --  07/02/21 1315 128/90 -- -- 86 -- 99 % -- --  07/02/21 1300 124/81 -- -- 79 -- 97 % -- --  07/02/21 1245 124/76 -- -- 85 -- 96 % -- --  07/02/21 1230 126/83 -- -- 75 -- 96 % -- --  07/02/21 1215 128/84 -- -- 70 -- 98 % -- --  07/02/21 1200 135/86 -- -- 69 -- 99 % -- --  07/02/21 1145 (!) 129/91 -- -- 76 -- 98 % -- --  07/02/21 1140 128/89 -- -- 78 -- 99 % -- --  07/02/21 1117 130/87 98.4 F (36.9 C) Oral 79 17 99 % 5\' 5"  (1.651 m) 84.6 kg     Results for orders placed or performed during the hospital encounter of 07/02/21 (from the past 24 hour(s))  CBC     Status: Abnormal   Collection Time: 07/02/21 10:55 AM  Result Value Ref Range   WBC 7.2 4.0 - 10.5 K/uL   RBC 3.58 (L) 3.87 - 5.11 MIL/uL   Hemoglobin 7.3 (L) 12.0 - 15.0 g/dL   HCT 40.924.9 (L) 81.136.0 - 91.446.0 %   MCV 69.6 (L) 80.0 - 100.0 fL   MCH 20.4 (L) 26.0 - 34.0 pg   MCHC 29.3 (L) 30.0 - 36.0 g/dL   RDW 78.218.5 (H) 95.611.5 - 21.315.5 %   Platelets 268 150 - 400 K/uL   nRBC 0.3 (H) 0.0 - 0.2 %  Comprehensive metabolic panel     Status: Abnormal   Collection Time: 07/02/21 10:55 AM  Result Value Ref Range   Sodium 137 135 - 145 mmol/L   Potassium 3.4 (L) 3.5 - 5.1 mmol/L   Chloride 108 98 - 111 mmol/L   CO2 21 (L) 22 - 32 mmol/L   Glucose, Bld 88 70 - 99 mg/dL   BUN 8 6 - 20 mg/dL   Creatinine, Ser 0.860.61 0.44 - 1.00 mg/dL   Calcium 8.5 (L) 8.9 - 10.3 mg/dL   Total Protein 6.1 (L) 6.5 - 8.1 g/dL   Albumin 2.5 (L) 3.5 - 5.0 g/dL   AST 14 (L) 15 -  41 U/L   ALT 9 0 - 44 U/L   Alkaline Phosphatase 86 38 - 126 U/L   Total Bilirubin 1.0 0.3 -  1.2 mg/dL   GFR, Estimated >65 >78 mL/min   Anion gap 8 5 - 15  Protein / creatinine ratio, urine     Status: None   Collection Time: 07/02/21 11:31 AM  Result Value Ref Range   Creatinine, Urine 228.40 mg/dL   Total Protein, Urine 21 mg/dL   Protein Creatinine Ratio 0.09 0.00 - 0.15 mg/mg[Cre]  Urinalysis, Routine w reflex microscopic Urine, Clean Catch     Status: Abnormal   Collection Time: 07/02/21 11:31 AM  Result Value Ref Range   Color, Urine YELLOW YELLOW   APPearance HAZY (A) CLEAR   Specific Gravity, Urine 1.027 1.005 - 1.030   pH 6.0 5.0 - 8.0   Glucose, UA NEGATIVE NEGATIVE mg/dL   Hgb urine dipstick NEGATIVE NEGATIVE   Bilirubin Urine NEGATIVE NEGATIVE   Ketones, ur NEGATIVE NEGATIVE mg/dL   Protein, ur NEGATIVE NEGATIVE mg/dL   Nitrite NEGATIVE NEGATIVE   Leukocytes,Ua TRACE (A) NEGATIVE   RBC / HPF 0-5 0 - 5 RBC/hpf   WBC, UA 0-5 0 - 5 WBC/hpf   Bacteria, UA MANY (A) NONE SEEN   Squamous Epithelial / LPF 0-5 0 - 5   Mucus PRESENT      MAU Course  Procedures  MDM CBC notable for Hgb/Hct 7.3/24.9. Plts 268. CMP normal, Cr 0.61. UPC 0.09. UA with no proteinuria, trace leukocytes + bacteria. Less concerned for pre-eclampsia due to self-resolving headache, borderline pressures, no proteinuria, normal liver enzymes, and normal platelets. Concern for gestational hypertension given elevated blood pressures.   1314: D/W Dr. Donavan Foil recommends fereheme infusion today and BP check in the office on Friday.   Reviewed with patient the recommendation for fereheme infusion today. Patient declines to proceed with infusion today. She states that she needs to go to work, and would like to see about scheduling an outpatient infusion. Will send message to the clinical staff to have patient return on Friday for BP check and to see about setting up outpatient infusion. Patient aware of risks/benefits.   Assessment and Plan  Headache Elevated blood pressures in third trimester of  pregnancy - UPC 0.09, Cr 0.61 - Blood pressures have been stable in the hospital, 124-135 systolic and 76-91 diastolic - Follow-up on 6/16 for routine prenatal  - Will message clinical staff to get patient set up for BP check on Friday 07/05/2021  Anemia - Hgb/Hct 7.3/24.9 - Patient unable to stay for IV infusion due to transportation barriers - Message sent to nurse to follow-up in clinic at [facility] 07/02/2021, 12:20 PM    Attestation of Supervision of Student:  I confirm that I have verified the information documented in the medical student's note and that I have also personally reperformed the history, physical exam and all medical decision making activities.  I have verified that all services and findings are accurately documented in this student's note; and I agree with management and plan as outlined in the documentation. I have also made any necessary editorial changes.  Thressa Sheller DNP, CNM  07/02/21  1:43 PM

## 2021-07-02 NOTE — Progress Notes (Signed)
Patient presents for ROB and GBS. Patient has no concerns today. Declines TDAP today.   Patient states that she has intermittent headaches, seeing black spots in the morning when she gets up to fast, and some left upper quadrant pain that has been present for about a month.

## 2021-07-02 NOTE — Addendum Note (Signed)
Addended by: Coral Ceo A on: 07/02/2021 02:46 PM   Modules accepted: Orders

## 2021-07-02 NOTE — MAU Note (Signed)
..  Samantha Blair is a 26 y.o. at [redacted]w[redacted]d here in MAU reporting: Sent here from office for elevated BP's. She reports around five minutes ago she began feeling intermittent lower abdominal cramping. Denies current HA but reports she had one in the office. She reports when she stands from sitting her vision is slightly burry. Denies RUQ/epigastric pain and edema. Denies VB or LOF. +FM.   She reports she had PreE with her twin pregnancy.  Pain score:  3/10 lower abdomen  Vitals:   07/02/21 1117  BP: 130/87  Pulse: 79  Resp: 17  Temp: 98.4 F (36.9 C)  SpO2: 99%     FHT: 135 initial external  Lab orders placed from triage:  UA

## 2021-07-03 ENCOUNTER — Other Ambulatory Visit: Payer: Self-pay | Admitting: Advanced Practice Midwife

## 2021-07-03 ENCOUNTER — Ambulatory Visit (INDEPENDENT_AMBULATORY_CARE_PROVIDER_SITE_OTHER): Payer: Medicaid Other | Admitting: *Deleted

## 2021-07-03 ENCOUNTER — Telehealth (HOSPITAL_COMMUNITY): Payer: Self-pay | Admitting: *Deleted

## 2021-07-03 ENCOUNTER — Encounter (HOSPITAL_COMMUNITY): Payer: Self-pay | Admitting: *Deleted

## 2021-07-03 VITALS — BP 122/80 | HR 74 | Temp 98.8°F | Resp 20 | Ht 65.0 in | Wt 191.2 lb

## 2021-07-03 DIAGNOSIS — O133 Gestational [pregnancy-induced] hypertension without significant proteinuria, third trimester: Secondary | ICD-10-CM

## 2021-07-03 DIAGNOSIS — O99013 Anemia complicating pregnancy, third trimester: Secondary | ICD-10-CM | POA: Diagnosis not present

## 2021-07-03 LAB — CERVICOVAGINAL ANCILLARY ONLY
Bacterial Vaginitis (gardnerella): NEGATIVE
Candida Glabrata: NEGATIVE
Candida Vaginitis: NEGATIVE
Chlamydia: NEGATIVE
Comment: NEGATIVE
Comment: NEGATIVE
Comment: NEGATIVE
Comment: NEGATIVE
Comment: NEGATIVE
Comment: NORMAL
Neisseria Gonorrhea: NEGATIVE
Trichomonas: NEGATIVE

## 2021-07-03 MED ORDER — SODIUM CHLORIDE 0.9 % IV SOLN
500.0000 mg | Freq: Once | INTRAVENOUS | Status: AC
  Administered 2021-07-03: 500 mg via INTRAVENOUS
  Filled 2021-07-03: qty 25

## 2021-07-03 NOTE — Progress Notes (Signed)
Diagnosis: Iron Deficiency Anemia  Provider:  Chilton Greathouse, MD  Procedure: Infusion  IV Type: Peripheral, IV Location: R Antecubital  Venofer (Iron Sucrose), Dose: 500 mg  Infusion Start Time: 0925 am  Infusion Stop Time: 1252 am  Post Infusion IV Care: Observation period completed and Peripheral IV Discontinued  Discharge: Condition: Good, Destination: Home . AVS provided to patient.   Performed by:  Forrest Moron, RN

## 2021-07-03 NOTE — Telephone Encounter (Signed)
Preadmission screen  

## 2021-07-04 ENCOUNTER — Ambulatory Visit: Payer: Medicaid Other

## 2021-07-06 LAB — STREP GP B CULTURE+RFLX: Strep Gp B Culture+Rflx: NEGATIVE

## 2021-07-07 ENCOUNTER — Inpatient Hospital Stay (HOSPITAL_COMMUNITY)
Admission: AD | Admit: 2021-07-07 | Payer: Medicaid Other | Source: Home / Self Care | Admitting: Obstetrics and Gynecology

## 2021-07-07 ENCOUNTER — Other Ambulatory Visit: Payer: Self-pay

## 2021-07-07 ENCOUNTER — Inpatient Hospital Stay (HOSPITAL_COMMUNITY): Payer: Medicaid Other

## 2021-07-07 ENCOUNTER — Encounter (HOSPITAL_COMMUNITY): Payer: Self-pay | Admitting: Obstetrics and Gynecology

## 2021-07-07 ENCOUNTER — Inpatient Hospital Stay (HOSPITAL_COMMUNITY)
Admission: AD | Admit: 2021-07-07 | Discharge: 2021-07-09 | DRG: 798 | Disposition: A | Discharge status: Home / Self Care | Payer: Medicaid Other | Attending: Obstetrics and Gynecology | Admitting: Obstetrics and Gynecology

## 2021-07-07 DIAGNOSIS — Z302 Encounter for sterilization: Secondary | ICD-10-CM | POA: Diagnosis not present

## 2021-07-07 DIAGNOSIS — Z9079 Acquired absence of other genital organ(s): Secondary | ICD-10-CM

## 2021-07-07 DIAGNOSIS — Z87891 Personal history of nicotine dependence: Secondary | ICD-10-CM | POA: Diagnosis not present

## 2021-07-07 DIAGNOSIS — Z3480 Encounter for supervision of other normal pregnancy, unspecified trimester: Secondary | ICD-10-CM

## 2021-07-07 DIAGNOSIS — O9902 Anemia complicating childbirth: Secondary | ICD-10-CM | POA: Diagnosis present

## 2021-07-07 DIAGNOSIS — O99013 Anemia complicating pregnancy, third trimester: Secondary | ICD-10-CM | POA: Diagnosis present

## 2021-07-07 DIAGNOSIS — O34219 Maternal care for unspecified type scar from previous cesarean delivery: Secondary | ICD-10-CM | POA: Diagnosis present

## 2021-07-07 DIAGNOSIS — O134 Gestational [pregnancy-induced] hypertension without significant proteinuria, complicating childbirth: Secondary | ICD-10-CM | POA: Diagnosis present

## 2021-07-07 DIAGNOSIS — O133 Gestational [pregnancy-induced] hypertension without significant proteinuria, third trimester: Secondary | ICD-10-CM

## 2021-07-07 DIAGNOSIS — Z3A37 37 weeks gestation of pregnancy: Secondary | ICD-10-CM

## 2021-07-07 DIAGNOSIS — O139 Gestational [pregnancy-induced] hypertension without significant proteinuria, unspecified trimester: Secondary | ICD-10-CM

## 2021-07-07 DIAGNOSIS — O34211 Maternal care for low transverse scar from previous cesarean delivery: Secondary | ICD-10-CM | POA: Diagnosis not present

## 2021-07-07 DIAGNOSIS — O326XX1 Maternal care for compound presentation, fetus 1: Secondary | ICD-10-CM | POA: Diagnosis not present

## 2021-07-07 HISTORY — DX: Gestational (pregnancy-induced) hypertension without significant proteinuria, unspecified trimester: O13.9

## 2021-07-07 LAB — COMPREHENSIVE METABOLIC PANEL
ALT: 13 U/L (ref 0–44)
AST: 19 U/L (ref 15–41)
Albumin: 2.7 g/dL — ABNORMAL LOW (ref 3.5–5.0)
Alkaline Phosphatase: 107 U/L (ref 38–126)
Anion gap: 7 (ref 5–15)
BUN: 8 mg/dL (ref 6–20)
CO2: 22 mmol/L (ref 22–32)
Calcium: 8.7 mg/dL — ABNORMAL LOW (ref 8.9–10.3)
Chloride: 109 mmol/L (ref 98–111)
Creatinine, Ser: 0.66 mg/dL (ref 0.44–1.00)
GFR, Estimated: >60 mL/min (ref >60)
Glucose, Bld: 88 mg/dL (ref 70–99)
Potassium: 3.4 mmol/L — ABNORMAL LOW (ref 3.5–5.1)
Sodium: 138 mmol/L (ref 135–145)
Total Bilirubin: 0.9 mg/dL (ref 0.3–1.2)
Total Protein: 6.3 g/dL — ABNORMAL LOW (ref 6.5–8.1)

## 2021-07-07 LAB — CBC
HCT: 25.8 % — ABNORMAL LOW (ref 36.0–46.0)
Hemoglobin: 7.5 g/dL — ABNORMAL LOW (ref 12.0–15.0)
MCH: 21 pg — ABNORMAL LOW (ref 26.0–34.0)
MCHC: 29.1 g/dL — ABNORMAL LOW (ref 30.0–36.0)
MCV: 72.3 fL — ABNORMAL LOW (ref 80.0–100.0)
Platelets: 235 10*3/uL (ref 150–400)
RBC: 3.57 MIL/uL — ABNORMAL LOW (ref 3.87–5.11)
RDW: 20.4 % — ABNORMAL HIGH (ref 11.5–15.5)
WBC: 8 10*3/uL (ref 4.0–10.5)
nRBC: 0.7 % — ABNORMAL HIGH (ref 0.0–0.2)

## 2021-07-07 LAB — TYPE AND SCREEN
ABO/RH(D): O POS
Antibody Screen: NEGATIVE

## 2021-07-07 LAB — PROTEIN / CREATININE RATIO, URINE
Creatinine, Urine: 343.89 mg/dL
Protein Creatinine Ratio: 0.09 mg/mg{Cre} (ref 0.00–0.15)
Total Protein, Urine: 30 mg/dL

## 2021-07-07 LAB — RPR: RPR Ser Ql: NONREACTIVE

## 2021-07-07 MED ORDER — METHYLERGONOVINE MALEATE 0.2 MG/ML IJ SOLN
0.2000 mg | Freq: Once | INTRAMUSCULAR | Status: AC
Start: 2021-07-08 — End: 2021-07-07

## 2021-07-07 MED ORDER — METHYLERGONOVINE MALEATE 0.2 MG/ML IJ SOLN
INTRAMUSCULAR | Status: AC
  Administered 2021-07-07: 0.2 mg via INTRAMUSCULAR
  Filled 2021-07-07: qty 1

## 2021-07-07 MED ORDER — TERBUTALINE SULFATE 1 MG/ML IJ SOLN: 0.2500 mg | Freq: Once | INTRAMUSCULAR | Status: DC | PRN

## 2021-07-07 MED ORDER — LIDOCAINE HCL (PF) 1 % IJ SOLN: 30.0000 mL | INTRAMUSCULAR | Status: DC | PRN

## 2021-07-07 MED ORDER — FENTANYL CITRATE (PF) 100 MCG/2ML IJ SOLN
100.0000 ug | INTRAMUSCULAR | Status: DC | PRN
  Administered 2021-07-07 (×4): 100 ug via INTRAVENOUS
  Filled 2021-07-07 (×4): qty 2

## 2021-07-07 MED ORDER — OXYCODONE-ACETAMINOPHEN 5-325 MG PO TABS: 1.0000 | ORAL_TABLET | ORAL | Status: DC | PRN

## 2021-07-07 MED ORDER — ACETAMINOPHEN 325 MG PO TABS: 650.0000 mg | ORAL_TABLET | ORAL | Status: DC | PRN

## 2021-07-07 MED ORDER — OXYTOCIN-SODIUM CHLORIDE 30-0.9 UT/500ML-% IV SOLN: 1.0000 m[IU]/min | INTRAVENOUS | Status: DC

## 2021-07-07 MED ORDER — OXYCODONE-ACETAMINOPHEN 5-325 MG PO TABS: 2.0000 | ORAL_TABLET | ORAL | Status: DC | PRN

## 2021-07-07 MED ORDER — LACTATED RINGERS IV SOLN: INTRAVENOUS | Status: DC

## 2021-07-07 MED ORDER — ONDANSETRON HCL 4 MG/2ML IJ SOLN: 4.0000 mg | Freq: Four times a day (QID) | INTRAMUSCULAR | Status: DC | PRN

## 2021-07-07 MED ORDER — LACTATED RINGERS IV SOLN: 500.0000 mL | INTRAVENOUS | Status: DC | PRN

## 2021-07-07 MED ORDER — HYDROXYZINE HCL 25 MG PO TABS
50.0000 mg | ORAL_TABLET | Freq: Three times a day (TID) | ORAL | Status: DC | PRN
  Administered 2021-07-07: 50 mg via ORAL
  Filled 2021-07-07: qty 1

## 2021-07-07 MED ORDER — OXYTOCIN-SODIUM CHLORIDE 30-0.9 UT/500ML-% IV SOLN: 2.5000 [IU]/h | INTRAVENOUS | Status: DC

## 2021-07-07 MED ORDER — SOD CITRATE-CITRIC ACID 500-334 MG/5ML PO SOLN: 30.0000 mL | ORAL | Status: DC | PRN

## 2021-07-07 MED ORDER — TRANEXAMIC ACID-NACL 1000-0.7 MG/100ML-% IV SOLN
1000.0000 mg | Freq: Once | INTRAVENOUS | Status: AC
  Administered 2021-07-07: 1000 mg via INTRAVENOUS
  Filled 2021-07-07: qty 100

## 2021-07-07 MED ORDER — OXYTOCIN-SODIUM CHLORIDE 30-0.9 UT/500ML-% IV SOLN
1.0000 m[IU]/min | INTRAVENOUS | Status: DC
  Administered 2021-07-07: 2 m[IU]/min via INTRAVENOUS
  Filled 2021-07-07: qty 500

## 2021-07-07 MED ORDER — OXYTOCIN BOLUS FROM INFUSION
333.0000 mL | Freq: Once | INTRAVENOUS | Status: AC
  Administered 2021-07-07: 333 mL via INTRAVENOUS

## 2021-07-07 MED ORDER — HYDROXYZINE HCL 50 MG/ML IM SOLN: 50.0000 mg | Freq: Four times a day (QID) | INTRAMUSCULAR | Status: DC | PRN

## 2021-07-07 NOTE — H&P (Addendum)
Kenda Nakasone is a 26 y.o. female [phone]  at  [redacted]w[redacted]d  presenting for Induction of Labor for Gestational Hypertension.  Denies headache or visual changes. She denies contractions but verbalizes that she if feeling very nervous. She has never been induced before. She is for [facility]. She continues to desire BTL.  Pregnancy has been followed at [facility] and remarkable for: Patient Active Problem List   Diagnosis Date Noted   Gestational hypertension 07/07/2021   Anemia of pregnancy in third trimester 05/23/2021   Supervision of other normal pregnancy, antepartum 02/26/2021   Vitamin D deficiency 11/18/2020   MDD (major depressive disorder), recurrent episode, severe ([place]) 11/16/2020   MDD (major depressive disorder) 11/16/2020   VBAC (vaginal birth after Cesarean) 12/09/2015    OB History     Gravida  5   Para  2   Term  1   Preterm  1   AB  1   Living  3      SAB      IAB  1   Ectopic      Multiple  1   Live Births  3          Past Medical History:  Diagnosis Date   Abortion    Gestational hypertension 07/07/2021   Goiter    left side of neck   History of pre-eclampsia in prior pregnancy, currently pregnant    Past Surgical History:  Procedure Laterality Date   CESAREAN SECTION     Family History: family history includes Cancer in her maternal grandfather and maternal grandmother. Social History:  reports that she has quit smoking. She has never used smokeless tobacco. She reports that she does not currently use alcohol. She reports that she does not use drugs.     Maternal Diabetes: No Genetic Screening: Normal Maternal Ultrasounds/Referrals: Normal Fetal Ultrasounds or other Referrals:  None Maternal Substance Abuse:  No Significant Maternal Medications:  None Significant Maternal Lab Results:  Group B Strep negative Other Comments:  None  Review of Systems  Constitutional:  Negative for chills and fever.  Eyes:  Negative for visual disturbance.   Respiratory:  Negative for shortness of breath.   Gastrointestinal:  Negative for abdominal pain, diarrhea, nausea and vomiting.  Genitourinary:  Negative for pelvic pain and vaginal bleeding.  Neurological:  Negative for weakness.   Maternal Medical History:  Reason for admission: Nausea. IOL for Gestational Hypertension   Contractions: Frequency: irregular.   Perceived severity is mild.   Fetal activity: Perceived fetal activity is normal.   Last perceived fetal movement was within the past hour.   Prenatal complications: PIH.   No placental abnormality.   Prenatal Complications - Diabetes: none.     Blood pressure (!) 142/84, pulse 72, temperature 98.6 F (37 C), temperature source Oral, resp. rate 18, height 5\' 5"  (1.651 m), weight 85.8 kg, last menstrual period 10/21/2020, SpO2 98 %, unknown if currently breastfeeding. Maternal Exam:  Uterine Assessment: Contraction strength is mild.  Contraction frequency is irregular.  Abdomen: Patient reports no abdominal tenderness.   Fetal Exam Fetal Monitor Review: Mode: ultrasound.   Baseline rate: 135.  Variability: moderate (6-25 bpm).   Pattern: accelerations present and no decelerations.   Fetal State Assessment: Category I - tracings are normal.   Physical Exam Constitutional:      General: She is not in acute distress.    Appearance: She is not ill-appearing or toxic-appearing.  HENT:     Head: Normocephalic.  Cardiovascular:  Rate and Rhythm: Normal rate.  Pulmonary:     Effort: Pulmonary effort is normal.  Abdominal:     General: There is no distension.     Tenderness: There is no abdominal tenderness. There is no guarding.  Musculoskeletal:        General: No swelling. Normal range of motion.     Cervical back: Normal range of motion.  Skin:    General: Skin is warm.  Neurological:     General: No focal deficit present.     Mental Status: She is alert.     Deep Tendon Reflexes: Reflexes normal.   Psychiatric:        Mood and Affect: Mood normal.     Cervical exam deferred for now since RN is starting IV  Prenatal labs: ABO, Rh: O/Positive/-- (02/07 1308) Antibody: Negative (02/07 1308) Rubella: 11.20 (02/07 1308) RPR: Non Reactive (04/24 1147)  HBsAg: Negative (02/07 1308)  HIV: Non Reactive (04/24 1147)  GBS: Negative/-- (06/06 1339)   Assessment/Plan: --26 y.o. [id number] at [redacted]w[redacted]d for TOLAC --IOL GHTN, PEC labs WNL, no severe range BPs or severe symptoms --Cat I tracing --GBS Neg  #Labor: Foley balloon placement attempted, patient unable to tolerate digital contact with cervix. Agreeable to PO Vistaril, possibly IV Fentanyl before attempting placement again. Discussed foley bulb + low dose Pitocin as safe and reliable method of initiating IOL. Patient previously preferred AROM but may be open to low dose Pitocin #Pain: Desires unmedicated labor, may use Nitrous in active labor #MOF: Breast #MOC: BTL (consent signed 05/20/2021, visible in Media tab) #Circ: N/A #PP Planning: Patient declines blood products even if life-threatening emergency. Discussed Hgb of 7.5 on admission, patient verbally consents to TXA at delivery (witnessed by RN)   Mallie Snooks, [place], MSN, CNM Certified Nurse Midwife, [place] for [place], [place]

## 2021-07-07 NOTE — Progress Notes (Signed)
Samantha Blair is a 26 y.o. [id number] at [redacted]w[redacted]d   Subjective: Feeling "sleepy" after PO Vistaril. Verbalizes willingness to attempt foley bulb placement  Objective: BP 126/86   Pulse 79   Temp 98.6 F (37 C) (Oral)   Resp 18   Ht 5\' 5"  (1.651 m)   Wt 85.8 kg   LMP 10/21/2020 (Exact Date)   SpO2 98%   BMI 31.48 kg/m  No intake/output data recorded. No intake/output data recorded.  FHT:  FHR: 125 bpm, variability: moderate,  accelerations:  Present,  decelerations:  Absent UC:   irregular SVE:   Dilation: 1 Effacement (%): Thick Station: -2 Exam by:: [email or web address] CNM  Labs: Lab Results  Component Value Date   WBC 8.0 07/07/2021   HGB 7.5 (L) 07/07/2021   HCT 25.8 (L) 07/07/2021   MCV 72.3 (L) 07/07/2021   PLT 235 07/07/2021    Assessment / Plan: --Cat I tracing --Foley balloon placed after IV Fentanyl. Inflated to 40mL --Patient agreeable to low dose Pitocin while foley balloon is in place. Will plan to discontinue Pitocin when foley is dislodged, assess for AROM per patient preference --GHTN, normotensive, asymptomatic --Anticipate vaginal birth  72m, Calvert Cantor 07/07/2021, 11:05 AM

## 2021-07-07 NOTE — Progress Notes (Signed)
Samantha Blair is a 26 y.o. [id number] at [redacted]w[redacted]d   Subjective: Aware of contractions, rocking on birthing ball. Verbal consent to check cervix and possibly AROM  Objective: BP 121/80   Pulse 81   Temp 98.3 F (36.8 C) (Oral)   Resp 18   Ht 5\' 5"  (1.651 m)   Wt 85.8 kg   LMP 10/21/2020 (Exact Date)   SpO2 98%   BMI 31.48 kg/m  No intake/output data recorded. No intake/output data recorded.  FHT:  FHR: 135 bpm, variability: moderate,  accelerations:  Present,  decelerations:  Absent UC:   irregular, every 2-4 minutes SVE:   Dilation: 4 Effacement (%): 60 Station: -3 Exam by:: [email or web address] CNM  Labs: Lab Results  Component Value Date   WBC 8.0 07/07/2021   HGB 7.5 (L) 07/07/2021   HCT 25.8 (L) 07/07/2021   MCV 72.3 (L) 07/07/2021   PLT 235 07/07/2021    Assessment / Plan: --26 y.o. 34 at [redacted]w[redacted]d  --Cat I tracing --S/p foley balloon --Pitocin infusing at 8 milliunits --Cervix now 4/60/-3 ( not significant change in station, not well tolerated by patient) --Not a candidate for AROM at this time, continue upright labor positions, assess for AROM with next exam --Anticipate vaginal birth/VBAC  08-22-1981, CNM 07/07/2021, 3:31 PM

## 2021-07-07 NOTE — Progress Notes (Signed)
Patient ID: Samantha Blair, female   DOB: 08-22-1995, 26 y.o.   MRN: [id number]  Notified by Judson Roch, RN that patient's is much more comfortable s/p foley bulb and patient would now like to start increasing Pitocin PRN, would like to wait for AROM until feeling more uncomfortable.  Cat I tracing  Next exam due around 1500 hours  Mallie Snooks, [place], MSN, CNM Certified Nurse Midwife, [employer] for [employer], [place]

## 2021-07-08 ENCOUNTER — Inpatient Hospital Stay (HOSPITAL_COMMUNITY): Payer: Medicaid Other | Admitting: Anesthesiology

## 2021-07-08 ENCOUNTER — Other Ambulatory Visit: Payer: Self-pay | Admitting: Obstetrics and Gynecology

## 2021-07-08 ENCOUNTER — Encounter (HOSPITAL_COMMUNITY): Payer: Self-pay | Admitting: Obstetrics and Gynecology

## 2021-07-08 ENCOUNTER — Encounter (HOSPITAL_COMMUNITY)
Admission: AD | Disposition: A | Discharge status: Home / Self Care | Payer: Self-pay | Source: Home / Self Care | Attending: Obstetrics and Gynecology

## 2021-07-08 ENCOUNTER — Other Ambulatory Visit: Payer: Self-pay

## 2021-07-08 DIAGNOSIS — Z302 Encounter for sterilization: Secondary | ICD-10-CM

## 2021-07-08 DIAGNOSIS — Z9079 Acquired absence of other genital organ(s): Secondary | ICD-10-CM

## 2021-07-08 HISTORY — PX: TUBAL LIGATION: SHX77

## 2021-07-08 LAB — CBC
HCT: 29.5 % — ABNORMAL LOW (ref 36.0–46.0)
Hemoglobin: 8.4 g/dL — ABNORMAL LOW (ref 12.0–15.0)
MCH: 20.7 pg — ABNORMAL LOW (ref 26.0–34.0)
MCHC: 28.5 g/dL — ABNORMAL LOW (ref 30.0–36.0)
MCV: 72.7 fL — ABNORMAL LOW (ref 80.0–100.0)
Platelets: 236 10*3/uL (ref 150–400)
RBC: 4.06 MIL/uL (ref 3.87–5.11)
RDW: 22.3 % — ABNORMAL HIGH (ref 11.5–15.5)
WBC: 14.3 10*3/uL — ABNORMAL HIGH (ref 4.0–10.5)
nRBC: 0.2 % (ref 0.0–0.2)

## 2021-07-08 SURGERY — LIGATION, FALLOPIAN TUBE, POSTPARTUM
Anesthesia: General | Wound class: Clean Contaminated

## 2021-07-08 MED ORDER — BENZOCAINE-MENTHOL 20-0.5 % EX AERO: 1.0000 "application " | INHALATION_SPRAY | CUTANEOUS | Status: DC | PRN

## 2021-07-08 MED ORDER — ONDANSETRON HCL 4 MG/2ML IJ SOLN
INTRAMUSCULAR | Status: DC | PRN
  Administered 2021-07-08: 4 mg via INTRAVENOUS

## 2021-07-08 MED ORDER — OXYCODONE HCL 5 MG PO TABS: 5.0000 mg | ORAL_TABLET | Freq: Once | ORAL | Status: DC | PRN

## 2021-07-08 MED ORDER — FENTANYL CITRATE (PF) 250 MCG/5ML IJ SOLN
INTRAMUSCULAR | Status: AC
  Filled 2021-07-08: qty 5

## 2021-07-08 MED ORDER — METOCLOPRAMIDE HCL 10 MG PO TABS
10.0000 mg | ORAL_TABLET | Freq: Once | ORAL | Status: AC
  Administered 2021-07-08: 10 mg via ORAL
  Filled 2021-07-08: qty 1

## 2021-07-08 MED ORDER — SUGAMMADEX SODIUM 200 MG/2ML IV SOLN
INTRAVENOUS | Status: DC | PRN
  Administered 2021-07-08: 200 mg via INTRAVENOUS

## 2021-07-08 MED ORDER — SODIUM CHLORIDE 0.9 % IR SOLN
Status: DC | PRN
  Administered 2021-07-08: 1000 mL

## 2021-07-08 MED ORDER — OXYCODONE HCL 5 MG PO TABS
5.0000 mg | ORAL_TABLET | ORAL | Status: DC | PRN
  Administered 2021-07-08: 5 mg via ORAL
  Filled 2021-07-08: qty 1

## 2021-07-08 MED ORDER — ACETAMINOPHEN 325 MG PO TABS
650.0000 mg | ORAL_TABLET | ORAL | Status: DC | PRN
  Administered 2021-07-08: 650 mg via ORAL
  Filled 2021-07-08: qty 2

## 2021-07-08 MED ORDER — FENTANYL CITRATE (PF) 100 MCG/2ML IJ SOLN
INTRAMUSCULAR | Status: AC
  Filled 2021-07-08: qty 2

## 2021-07-08 MED ORDER — DIBUCAINE (PERIANAL) 1 % EX OINT: 1.0000 "application " | TOPICAL_OINTMENT | CUTANEOUS | Status: DC | PRN

## 2021-07-08 MED ORDER — TETANUS-DIPHTH-ACELL PERTUSSIS 5-2.5-18.5 LF-MCG/0.5 IM SUSY: 0.5000 mL | PREFILLED_SYRINGE | Freq: Once | INTRAMUSCULAR | Status: DC

## 2021-07-08 MED ORDER — SIMETHICONE 80 MG PO CHEW: 80.0000 mg | CHEWABLE_TABLET | ORAL | Status: DC | PRN

## 2021-07-08 MED ORDER — FENTANYL CITRATE (PF) 100 MCG/2ML IJ SOLN
INTRAMUSCULAR | Status: DC | PRN
  Administered 2021-07-08: 100 ug via INTRAVENOUS
  Administered 2021-07-08 (×2): 50 ug via INTRAVENOUS

## 2021-07-08 MED ORDER — LACTATED RINGERS IV SOLN: INTRAVENOUS | Status: DC

## 2021-07-08 MED ORDER — ONDANSETRON HCL 4 MG PO TABS: 4.0000 mg | ORAL_TABLET | ORAL | Status: DC | PRN

## 2021-07-08 MED ORDER — DIPHENHYDRAMINE HCL 25 MG PO CAPS: 25.0000 mg | ORAL_CAPSULE | Freq: Four times a day (QID) | ORAL | Status: DC | PRN

## 2021-07-08 MED ORDER — PROPOFOL 10 MG/ML IV BOLUS
INTRAVENOUS | Status: DC | PRN
  Administered 2021-07-08: 200 mg via INTRAVENOUS

## 2021-07-08 MED ORDER — PROMETHAZINE HCL 25 MG/ML IJ SOLN: 6.2500 mg | INTRAMUSCULAR | Status: DC | PRN

## 2021-07-08 MED ORDER — WITCH HAZEL-GLYCERIN EX PADS: 1.0000 "application " | MEDICATED_PAD | CUTANEOUS | Status: DC | PRN

## 2021-07-08 MED ORDER — FENTANYL CITRATE (PF) 100 MCG/2ML IJ SOLN
25.0000 ug | INTRAMUSCULAR | Status: DC | PRN
  Administered 2021-07-08: 25 ug via INTRAVENOUS

## 2021-07-08 MED ORDER — STERILE WATER FOR IRRIGATION IR SOLN
Status: DC | PRN
  Administered 2021-07-08: 1000 mL

## 2021-07-08 MED ORDER — MIDAZOLAM HCL 2 MG/2ML IJ SOLN
INTRAMUSCULAR | Status: AC
  Filled 2021-07-08: qty 2

## 2021-07-08 MED ORDER — PROPOFOL 10 MG/ML IV BOLUS
INTRAVENOUS | Status: AC
  Filled 2021-07-08: qty 20

## 2021-07-08 MED ORDER — IBUPROFEN 600 MG PO TABS
600.0000 mg | ORAL_TABLET | Freq: Four times a day (QID) | ORAL | Status: DC
  Administered 2021-07-08 – 2021-07-09 (×5): 600 mg via ORAL
  Filled 2021-07-08 (×5): qty 1

## 2021-07-08 MED ORDER — ONDANSETRON HCL 4 MG/2ML IJ SOLN
INTRAMUSCULAR | Status: AC
  Filled 2021-07-08: qty 2

## 2021-07-08 MED ORDER — IRON SUCROSE 20 MG/ML IV SOLN
500.0000 mg | Freq: Once | INTRAVENOUS | Status: AC
  Administered 2021-07-08: 500 mg via INTRAVENOUS
  Filled 2021-07-08: qty 25

## 2021-07-08 MED ORDER — PRENATAL MULTIVITAMIN CH
1.0000 | ORAL_TABLET | Freq: Every day | ORAL | Status: DC
  Administered 2021-07-08 – 2021-07-09 (×2): 1 via ORAL
  Filled 2021-07-08 (×2): qty 1

## 2021-07-08 MED ORDER — MEASLES, MUMPS & RUBELLA VAC IJ SOLR: 0.5000 mL | Freq: Once | INTRAMUSCULAR | Status: DC

## 2021-07-08 MED ORDER — ROCURONIUM BROMIDE 100 MG/10ML IV SOLN
INTRAVENOUS | Status: DC | PRN
  Administered 2021-07-08: 60 mg via INTRAVENOUS

## 2021-07-08 MED ORDER — COCONUT OIL OIL: 1.0000 "application " | TOPICAL_OIL | Status: DC | PRN

## 2021-07-08 MED ORDER — NIFEDIPINE ER OSMOTIC RELEASE 30 MG PO TB24
30.0000 mg | ORAL_TABLET | Freq: Every day | ORAL | Status: DC
  Administered 2021-07-08 – 2021-07-09 (×2): 30 mg via ORAL
  Filled 2021-07-08 (×2): qty 1

## 2021-07-08 MED ORDER — OXYCODONE HCL 5 MG/5ML PO SOLN: 5.0000 mg | Freq: Once | ORAL | Status: DC | PRN

## 2021-07-08 MED ORDER — BUPIVACAINE HCL (PF) 0.25 % IJ SOLN
INTRAMUSCULAR | Status: AC
  Filled 2021-07-08: qty 30

## 2021-07-08 MED ORDER — ONDANSETRON HCL 4 MG/2ML IJ SOLN: 4.0000 mg | INTRAMUSCULAR | Status: DC | PRN

## 2021-07-08 MED ORDER — FAMOTIDINE 20 MG PO TABS
40.0000 mg | ORAL_TABLET | Freq: Once | ORAL | Status: AC
  Administered 2021-07-08: 40 mg via ORAL
  Filled 2021-07-08: qty 2

## 2021-07-08 MED ORDER — ROCURONIUM BROMIDE 10 MG/ML (PF) SYRINGE
PREFILLED_SYRINGE | INTRAVENOUS | Status: AC
  Filled 2021-07-08: qty 10

## 2021-07-08 MED ORDER — MEDROXYPROGESTERONE ACETATE 150 MG/ML IM SUSP
150.0000 mg | INTRAMUSCULAR | Status: DC | PRN
Start: 2021-07-08 — End: 2021-07-09

## 2021-07-08 MED ORDER — DEXAMETHASONE SODIUM PHOSPHATE 4 MG/ML IJ SOLN
INTRAMUSCULAR | Status: AC
  Filled 2021-07-08: qty 1

## 2021-07-08 MED ORDER — DEXAMETHASONE SODIUM PHOSPHATE 10 MG/ML IJ SOLN
INTRAMUSCULAR | Status: DC | PRN
  Administered 2021-07-08: 4 mg via INTRAVENOUS

## 2021-07-08 MED ORDER — SENNOSIDES-DOCUSATE SODIUM 8.6-50 MG PO TABS
2.0000 | ORAL_TABLET | Freq: Every day | ORAL | Status: DC
  Administered 2021-07-09: 2 via ORAL
  Filled 2021-07-08: qty 2

## 2021-07-08 MED ORDER — FUROSEMIDE 20 MG PO TABS
20.0000 mg | ORAL_TABLET | Freq: Every day | ORAL | Status: DC
  Administered 2021-07-08 – 2021-07-09 (×2): 20 mg via ORAL
  Filled 2021-07-08 (×2): qty 1

## 2021-07-08 SURGICAL SUPPLY — 24 items
CLEANER TIP ELECTROSURG 2X2 (MISCELLANEOUS) ×1 IMPLANT
DRSG OPSITE POSTOP 3X4 (GAUZE/BANDAGES/DRESSINGS) ×2 IMPLANT
DRSG OPSITE POSTOP 4X6 (GAUZE/BANDAGES/DRESSINGS) ×1 IMPLANT
DURAPREP 26ML APPLICATOR (WOUND CARE) ×2 IMPLANT
GLOVE BIOGEL PI IND STRL 7.0 (GLOVE) ×2 IMPLANT
GLOVE BIOGEL PI INDICATOR 7.0 (GLOVE) ×2
GLOVE SURG SS PI 7.0 STRL IVOR (GLOVE) ×2 IMPLANT
GOWN STRL REUS W/TWL LRG LVL3 (GOWN DISPOSABLE) ×2 IMPLANT
NEEDLE HYPO 22GX1.5 SAFETY (NEEDLE) ×2 IMPLANT
NS IRRIG 1000ML POUR BTL (IV SOLUTION) ×2 IMPLANT
PACK ABDOMINAL MINOR (CUSTOM PROCEDURE TRAY) ×2 IMPLANT
PENCIL BUTTON HOLSTER BLD 10FT (ELECTRODE) ×1 IMPLANT
PROTECTOR NERVE ULNAR (MISCELLANEOUS) ×2 IMPLANT
SPONGE GAUZE 2X2 8PLY STRL LF (GAUZE/BANDAGES/DRESSINGS) ×2 IMPLANT
SPONGE LAP 4X18 RFD (DISPOSABLE) ×2 IMPLANT
SUT MON AB 2-0 CT1 36 (SUTURE) ×2 IMPLANT
SUT PLAIN 0 NONE (SUTURE) IMPLANT
SUT VIC AB 0 CT1 27 (SUTURE) ×1
SUT VIC AB 0 CT1 27XBRD ANBCTR (SUTURE) ×1 IMPLANT
SUT VIC AB 3-0 PS2 18 (SUTURE) ×2 IMPLANT
SUT VICRYL 0 UR6 27IN ABS (SUTURE) ×2 IMPLANT
SYR CONTROL 10ML LL (SYRINGE) ×2 IMPLANT
TOWEL OR 17X24 6PK STRL BLUE (TOWEL DISPOSABLE) ×2 IMPLANT
WATER STERILE IRR 1000ML POUR (IV SOLUTION) ×2 IMPLANT

## 2021-07-08 NOTE — Lactation Note (Signed)
This note was copied from a baby's chart. Lactation Consultation Note  Patient Name: Samantha Blair TFTDD'[id number] Date: 07/08/2021 Reason for consult: Mother's request Age:28 hours Mom recently took infant off breast as LC entered the room. Per mom, infant has been BF for 1 hour, infant was swaddled in clothing, infant has not actively BF,  infant was sleeping  while latched at the  breast.  Per RN, she heard swallows when infant was latched earlier when she did a latch assessment.  LC discussed mom to breastfeed  infant skin to skin instead of with clothing or swaddled in blankets.because infant may become sleepy at the breast. LC discussed breast stimulation techniques to keep infant awake while breastfeeding such as : breast compressions, gently stroking infant's neck, shoulder and hands. Mom is very tired ,LC reviewed hand expression with mom,  since infant was still cuing to BF, mom hand expressed 12 mls of colostrum and spoon feed to infant, infant appeared content and mom plans to rest. Mom will continue to breastfeed infant by cues, on demand, 8 to 12+ times within 24 hours, skin to skin. Maternal Data    Feeding Mother's Current Feeding Choice: Breast Milk and Formula  LATCH Score                    Lactation Tools Discussed/Used    Interventions Interventions: Skin to skin;Education;Breast compression;Hand express;Expressed milk  Discharge    Consult Status Consult Status: Follow-up Date: 07/09/21 Follow-up type: In-patient    Samantha Blair 07/08/2021, 10:13 PM

## 2021-07-08 NOTE — Lactation Note (Signed)
This note was copied from a baby's chart. Lactation Consultation Note  Patient Name: Samantha Blair [id number] Date: 07/08/2021 Reason for consult: Initial assessment;Early term 37-38.6wks Age:27 hours  LC in to visit with P4 Mom of ET infant.  Mom in semi-side lying position with baby latched STS with a wide and deep latch to the breast.  Mom reports baby has been cluster feeding for the last hour.    Encouraged Mom to feed baby often with any feeding cues.  Due to baby being 37 wks, we will watch her for sleepiness and need for supplementing and pumping.    Reviewed hand expression and breast massage, colostrum flow is great.  Mom denies any questions currently.  Encouraged her to call prn for assistance.  Maternal Data Has patient been taught Hand Expression?: Yes Does the patient have breastfeeding experience prior to this delivery?: Yes How long did the patient breastfeed?: 3 months with her last (28 yrs old) and pumped with twins in the NICU for a couple weeks  Feeding Mother's Current Feeding Choice: Breast Milk and Formula  LATCH Score Latch: Grasps breast easily, tongue down, lips flanged, rhythmical sucking.  Audible Swallowing: Spontaneous and intermittent  Type of Nipple: Everted at rest and after stimulation  Comfort (Breast/Nipple): Soft / non-tender  Hold (Positioning): No assistance needed to correctly position infant at breast.  LATCH Score: 10  Interventions Interventions: Breast feeding basics reviewed;Skin to skin;Breast massage;Hand express;Support pillows;LC Services brochure  Consult Status Consult Status: Follow-up Date: 07/09/21 Follow-up type: In-patient    Broadus John 07/08/2021, 10:17 AM

## 2021-07-08 NOTE — Transfer of Care (Signed)
Immediate Anesthesia Transfer of Care Note  Patient: Samantha Blair  Procedure(s) Performed: POST PARTUM TUBAL LIGATION  Patient Location: PACU  Anesthesia Type:General  Level of Consciousness: sedated  Airway & Oxygen Therapy: Patient Spontanous Breathing and Patient connected to nasal cannula oxygen  Post-op Assessment: Report given to RN and Post -op Vital signs reviewed and stable  Post vital signs: Reviewed and stable  Last Vitals:  Vitals Value Taken Time  BP 123/82 07/08/21 1700  Temp    Pulse 63 07/08/21 1701  Resp 12 07/08/21 1701  SpO2 99 % 07/08/21 1701  Vitals shown include unvalidated device data.  Last Pain:  Vitals:   07/08/21 1425  TempSrc: Oral  PainSc:       Patients Stated Pain Goal: 1 (07/08/21 0145)  Complications: No notable events documented.

## 2021-07-08 NOTE — Op Note (Signed)
Postpartum Tubal Ligation Operative Note   Patient: Samantha Blair  Date of Procedure: 07/08/2021  Procedure: Postpartum bilateral Tubal Ligation via Bilateral salpingectomy   Indications: undesired fertility  Pre-operative Diagnosis: Postpartum tubal ligation .   Post-operative Diagnosis: Same and ascites  Surgeon: Surgeon(s) and Role:    * Betsy Rosello, Mary Sella, MD - Primary  Assistants: Dr. Mikey Bussing  An experienced assistant was required given the standard of surgical care given the complexity of the case.  This assistant was needed for exposure, dissection, suctioning, retraction, instrument exchange, assisting with delivery with administration of fundal pressure, and for overall help during the procedure.   Anesthesia: general  Anesthesiologist: Lucretia Kern, MD   Antibiotics: None   Estimated Blood Loss: 10 ml   Total IV Fluids: 800 ml  Urine Output:  200 cc OF clear urine  Specimens: bilateral fallopian tubes including fimbriae and sample of about 10 cc of peritoneal fluid to pathology   Complications: no complications   Indications: Samantha Blair is a 26 y.o. [id number] with undesired fertility, status post vaginal delivery, desires permanent sterilization.  Other reversible forms of contraception were discussed with patient; she declines all other modalities. Risks of procedure discussed with patient including but not limited to: risk of regret, permanence of method, bleeding, infection, injury to surrounding organs and need for additional procedures.  Failure risk of 1-2 % with increased risk of ectopic gestation if pregnancy occurs and possibility of post-tubal pain syndrome also discussed with patient. Clinical course notable for uncomplicated VBAC.  Findings: Normal uterus, tubes, and ovaries. Larger amount of ascites than is typically encountered during this procedure, sent to path.   Procedure Details: The patient was taken to the operating room where general  anesthesia (per patient request) was dosed and found to be adequate.  She was then placed in the dorsal supine position and prepped and draped in sterile fashion.  After an adequate timeout was performed, attention was turned to the patient's abdomen where a small transverse skin incision was made under the umbilical fold. The incision was taken down to the layer of fascia using the scalpel and blunt dissection, and fascia was incised, and the incision was extended bilaterally. The peritoneum was entered in a sharp fashion.   Attention was then turned to the fallopian tubes. A Kelly clamp was placed across the left fallopian tube taking care to incorporate the fimbriae. A second clamp was then placed below the first. The fallopian tube was then removed with Metzenbaum scissors. The pedicle was then ligated with 2-0 Monocryl suture and the second clamp was removed with excellent hemostasis noted. Then a second ligature of 0 Monocryl suture was placed below the remaining clamp, the clamp was then removed and again excellent hemostasis was observed. The same procedure was then carried out on the right fallopian tube with excellent hemostasis noted.  Throughout the procedure yellow ascites persistently obstructed the operating field despite repeatedly dabbing with a lap to remove it. This was a larger amount of ascites than is typically encountered in this procedure. Reassuringly uterine, fallopian, and ovarian appearance were all normal, but out of abundance of caution a sample of the fluid was collected and sent to pathology.   Good hemostasis was noted overall. All laps and instruments were then removed from the patient's abdomen and the fascial incision was repaired with 0 Vicryl, the subcutaneous tissue was not closed, and the skin was closed with a 4-0 Vicryl subcuticular stitch. The patient tolerated the procedure well.  Instrument, sponge, and needle counts were correct times three.  The patient was then  taken to the recovery room awake and in stable condition.  Disposition: PACU - hemodynamically stable.    Signed: Venora Maples, MD, MPH Center for [facility])

## 2021-07-08 NOTE — Progress Notes (Signed)
Patient desires permanent sterilization.  Other reversible forms of contraception were discussed with patient; she declines all other modalities. Risks of procedure discussed with patient including but not limited to: risk of regret, permanence of method, bleeding, infection, injury to surrounding organs and need for additional procedures.  Failure risk of about 1% with increased risk of ectopic gestation if pregnancy occurs was also discussed with patient.  Also discussed possibility of post-tubal pain syndrome. The patient concurred with the proposed plan, giving informed written consent for the procedures.  Patient has been NPO since 0700 and will remain NPO for the procedure. Anesthesia and OR aware.  Clarnce Flock, MD/MPH Attending Family Medicine Physician, [facility] for [facility], [place]

## 2021-07-08 NOTE — Anesthesia Preprocedure Evaluation (Addendum)
Anesthesia Evaluation  Patient identified by MRN, date of birth, ID band Patient awake    Reviewed: Allergy & Precautions, NPO status , Patient's Chart, lab work & pertinent test results  History of Anesthesia Complications Negative for: history of anesthetic complications  Airway Mallampati: III  TM Distance: >3 FB Neck ROM: Full    Dental  (+) Teeth Intact, Dental Advisory Given   Pulmonary neg pulmonary ROS, former smoker,    Pulmonary exam normal        Cardiovascular hypertension, Normal cardiovascular exam     Neuro/Psych Depression negative neurological ROS     GI/Hepatic negative GI ROS, Neg liver ROS,   Endo/Other  negative endocrine ROS  Renal/GU negative Renal ROS     Musculoskeletal negative musculoskeletal ROS (+)   Abdominal   Peds  Hematology  (+) Blood dyscrasia, anemia ,   Anesthesia Other Findings   Reproductive/Obstetrics (+) Pregnancy (PPM day1)                            Anesthesia Physical Anesthesia Plan  ASA: 2  Anesthesia Plan: General   Post-op Pain Management:    Induction: Intravenous  PONV Risk Score and Plan: 4 or greater and Ondansetron, Dexamethasone, Treatment may vary due to age or medical condition and Midazolam  Airway Management Planned: Oral ETT  Additional Equipment: None  Intra-op Plan:   Post-operative Plan: Extubation in OR  Informed Consent: I have reviewed the patients History and Physical, chart, labs and discussed the procedure including the risks, benefits and alternatives for the proposed anesthesia with the patient or authorized representative who has indicated his/her understanding and acceptance.     Dental advisory given  Plan Discussed with:   Anesthesia Plan Comments:         Anesthesia Quick Evaluation

## 2021-07-08 NOTE — Anesthesia Procedure Notes (Signed)
Procedure Name: Intubation Date/Time: 07/08/2021 3:44 PM  Performed by: Asher Muir, CRNAPre-anesthesia Checklist: Patient identified, Emergency Drugs available, Suction available and Patient being monitored Patient Re-evaluated:Patient Re-evaluated prior to induction Oxygen Delivery Method: Circle system utilized and Simple face mask Preoxygenation: Pre-oxygenation with 100% oxygen Induction Type: IV induction Ventilation: Mask ventilation without difficulty Laryngoscope Size: Mac, 3 and Glidescope Grade View: Grade I Tube type: Oral Tube size: 7.0 mm Number of attempts: 1 Airway Equipment and Method: Stylet and Video-laryngoscopy (in sniffing position) Placement Confirmation: ETT inserted through vocal cords under direct vision, positive ETCO2 and breath sounds checked- equal and bilateral Secured at: 21 (right lip) cm Tube secured with: Tape Dental Injury: Teeth and Oropharynx as per pre-operative assessment

## 2021-07-08 NOTE — Progress Notes (Shared)
POSTPARTUM PROGRESS NOTE  Subjective: Samantha Blair is a 26 y.o. [id number] PPD#1 s/p SVD at [redacted]w[redacted]d.  She reports she doing well. No acute events overnight. She denies any problems with ambulating, voiding or po intake. Denies nausea or vomiting. She has passed flatus. Pain is moderately controlled.  Lochia is ***.  Objective: Blood pressure 128/73, pulse 62, temperature 99.2 F (37.3 C), temperature source Oral, resp. rate 17, height 5\' 5"  (1.651 m), weight 85.8 kg, last menstrual period 10/21/2020, SpO2 99 %, unknown if currently breastfeeding.  Physical Exam:  General: alert, cooperative and no distress Chest: no respiratory distress Abdomen: soft, non-tender  Uterine Fundus: firm, appropriately tender Extremities: No calf swelling or tenderness  *** edema  Recent Labs    07/07/21 0811 07/08/21 0425  HGB 7.5* 8.4*  HCT 25.8* 29.5*    Assessment/Plan: Samantha Blair is a 26 y.o. [id number] PPD#1 s/p SVD at [redacted]w[redacted]d  Routine Postpartum Care: Doing well, pain well-controlled.  -- Continue routine care, lactation support  -- Contraception: BTL planned today at 1500 -- Feeding: breast  #Anemia of pregnancy HgB 7.5>8.4. IV iron already given.  Dispo: Plan for discharge PPD#2 given late evening delivery.  Renard Matter, MD, MPH OB Fellow, [facility] for [facility]

## 2021-07-08 NOTE — Discharge Summary (Signed)
Postpartum Discharge Summary    Patient Name: Samantha Blair DOB: Jun 13, 1995 MRN: [id number]  Date of admission: 07/07/2021 Delivery date:07/07/2021  Delivering provider: Renard Matter  Date of discharge: 07/09/2021  Admitting diagnosis: Gestational hypertension [O13.9] Intrauterine pregnancy: [redacted]w[redacted]d    Secondary diagnosis:  Principal Problem:   Gestational hypertension Active Problems:   VBAC (vaginal birth after Cesarean)   Supervision of other normal pregnancy, antepartum   Anemia of pregnancy in third trimester   H/O bilateral salpingectomy  Additional problems: None    Discharge diagnosis: Term Pregnancy Delivered, VBAC, and Gestational Hypertension                                              Post partum procedures: Bilateral salpingectomy w/noted ascites, culture pending Augmentation: AROM, Pitocin, and IP Foley Complications: None  Hospital course: Induction of Labor With Vaginal Delivery   26y.o. yo [id number] admitted to the hospital 07/07/2021 for induction of labor.  Indication for induction: Gestational hypertension.  Patient had an uncomplicated labor course as follows: Membrane Rupture Time/Date: 10:46 PM ,07/07/2021   Delivery Method:VBAC, Spontaneous  Episiotomy: None  Lacerations:  None  Details of delivery can be found in separate delivery note. Her blood pressures were 130s/80s in the post partum period and she was started on procardia 30xl and lasix.  She also had a starting HgB that was low at 7.5 and had a post partum HgB of 8.2. She was given IV iron.Patient otherwise had a routine postpartum course. Patient is discharged home 07/09/21.  Newborn Data: Birth date:07/07/2021  Birth time:11:39 PM  Gender:Female  Living status:Living  Apgars:9 ,9  Weight:3310 g   Magnesium Sulfate received: No BMZ received: No Rhophylac:N/A MMR:N/A T-DaP: declined Flu: No Transfusion:No  received IV iron  Physical exam  Vitals:   07/08/21 1926 07/08/21  2057 07/08/21 2338 07/09/21 0505  BP: (!) 140/95 124/83 117/80 115/81  Pulse: (!) 58 (!) 56 61 (!) 51  Resp: _0 Temp: 98.8 F (37.1 C)  98.6 F (37 C) 98.3 F (36.8 C)  TempSrc: Oral  Oral Oral  SpO2: 97% 97% 96% 98%  Weight:      Height:       General: alert, cooperative, and no distress Lochia: appropriate Uterine Fundus: firm Incision: Healing well with no significant drainage, No significant erythema, Dressing is clean, dry, and intact DVT Evaluation: No evidence of DVT seen on physical exam. Labs: Lab Results  Component Value Date   WBC 14.3 (H) 07/08/2021   HGB 8.4 (L) 07/08/2021   HCT 29.5 (L) 07/08/2021   MCV 72.7 (L) 07/08/2021   PLT 236 07/08/2021      Latest Ref Rng & Units 07/07/2021    8:11 AM  CMP  Glucose 70 - 99 mg/dL 88   BUN 6 - 20 mg/dL 8   Creatinine 0.44 - 1.00 mg/dL 0.66   Sodium 135 - 145 mmol/L 138   Potassium 3.5 - 5.1 mmol/L 3.4   Chloride 98 - 111 mmol/L 109   CO2 22 - 32 mmol/L 22   Calcium 8.9 - 10.3 mg/dL 8.7   Total Protein 6.5 - 8.1 g/dL 6.3   Total Bilirubin 0.3 - 1.2 mg/dL 0.9   Alkaline Phos 38 - 126 U/L 107   AST 15 - 41 U/L 19  ALT 0 - 44 U/L 13    Edinburgh Score:     No data to display           After visit meds:  Allergies as of 07/09/2021       Reactions   Penicillins Hives   Has patient had a PCN reaction causing immediate rash, facial/tongue/throat swelling, SOB or lightheadedness with hypotension: Yes Has patient had a PCN reaction causing severe rash involving mucus membranes or skin necrosis: Yes Has patient had a PCN reaction that required hospitalization Yes Has patient had a PCN reaction occurring within the last 10 years: No If all of the above answers are "NO", then may proceed with Cephalosporin use.        Medication List     TAKE these medications    acetaminophen 325 MG tablet Commonly known as: Tylenol Take 2 tablets (650 mg total) by mouth every 4 (four) hours as needed (for pain  scale < 4).   furosemide 20 MG tablet Commonly known as: LASIX Take 1 tablet (20 mg total) by mouth daily.   ibuprofen 600 MG tablet Commonly known as: ADVIL Take 1 tablet (600 mg total) by mouth every 6 (six) hours.   NIFEdipine 30 MG 24 hr tablet Commonly known as: ADALAT CC Take 1 tablet (30 mg total) by mouth daily.   oxyCODONE 5 MG immediate release tablet Commonly known as: Oxy IR/ROXICODONE Take 1-2 tablets (5-10 mg total) by mouth every 4 (four) hours as needed (pain scale >7).   PrePLUS 27-1 MG Tabs Take 1 tablet by mouth daily.         Discharge home in stable condition Infant Feeding: Breast Infant Disposition:home with mother Discharge instruction: per After Visit Summary and Postpartum booklet. Activity: Advance as tolerated. Pelvic rest for 6 weeks.  Diet: routine diet Future Appointments: Future Appointments  Date Time Provider Combine  07/15/2021 10:20 AM [place] None  08/14/2021 10:55 AM Woodroe Mode, MD [facility] None   Follow up Visit: Message sent to [facility] by Dr. Cy Blamer on 6/12  Please schedule this patient for a In person postpartum visit in 4 weeks with the following provider: Any provider. Additional Postpartum F/U:BP check 1 week  High risk pregnancy complicated by: HTN Delivery mode:  VBAC, Spontaneous  Anticipated Birth Control:  BTL done PP   07/09/2021 Renee Harder, CNM

## 2021-07-08 NOTE — Progress Notes (Signed)
POSTPARTUM PROGRESS NOTE  Post Partum Day 1  Subjective:  Samantha Blair is a 26 y.o. [id number] s/p VBAC at [redacted]w[redacted]d.  She reports she is doing well. No acute events overnight. She denies any problems with ambulating, voiding or po intake. Denies nausea or vomiting.  Pain is well controlled.  Lochia is less than a period.  Objective: Blood pressure 137/85, pulse (!) 55, temperature 99.2 F (37.3 C), temperature source Oral, resp. rate 18, height 5\' 5"  (1.651 m), weight 85.8 kg, last menstrual period 10/21/2020, SpO2 99 %, unknown if currently breastfeeding.  Physical Exam:  General: alert, cooperative and no distress Chest: no respiratory distress Skin: warm, dry  Recent Labs    07/07/21 0811 07/08/21 0425  HGB 7.5* 8.4*  HCT 25.8* 29.5*    Assessment/Plan: Samantha Blair is a 26 y.o. [id number] s/p VBAC at [redacted]w[redacted]d   PPD#1 - Doing well  Routine postpartum care Anemia: last hgb 8.4, s/p IV venofer, declines blood products even in life threatening scenarios. Contraception: consented for BTL, see separate note Feeding: breast Dispo: Plan for discharge PPD#2.  gHTN: normotensive at present on procardia 30 XL and on lasix 20mg  PO x5 days. 1 wk BP check after discharge scheduled for 07/15/2021.   LOS: 1 day    Clarnce Flock, MD/MPH Attending Family Medicine Physician, [facility] for [facility], [facility]  07/08/2021, 11:49 AM

## 2021-07-08 NOTE — Progress Notes (Signed)
Labor Progress Note Samantha Blair is a 26 y.o. [id number] at [redacted]w[redacted]d presented for TOLAC/IOL for gHTN S:  Reports she is feeling more discomfort  O:  BP (!) 145/88   Pulse 82   Temp 98.7 F (37.1 C) (Oral)   Resp 16   Ht 5\' 5"  (1.651 m)   Wt 85.8 kg   LMP 10/21/2020 (Exact Date)   SpO2 98%   BMI 31.48 kg/m  EFM: 120/moderate variability/+accels/no decels  CVE: Dilation: 5/50/-3 Presentation: Vertex Exam by:: Dr. [email or web address]   A&P: 26 y.o. 34 at [redacted]w[redacted]d presented for TOLAC/IOL for gHTN #Labor: Progressing well. Initially attempted AROM and patient did not tolerate exam. We discussed position changes to help facilitate fetal head descent and then attempting AROM again in a few hours. We also discussed pain control options in depth and patient considering nitrous. We discussed option of using nitrous during AROM. At that time right before I exited room patient changed mind and would like to proceed with AROM now. Performed with small amount of clear fluid out and tolerated well by fetus. #Pain: IV pain medicines #FWB: Cat I #GBS negative   #gHTN Mostly BP in 120s-130s. No symptoms  [redacted]w[redacted]d, MD, MPH OB Fellow, [facility]

## 2021-07-09 ENCOUNTER — Other Ambulatory Visit (HOSPITAL_COMMUNITY): Payer: Self-pay

## 2021-07-09 ENCOUNTER — Encounter: Payer: Self-pay | Admitting: Pulmonary Disease

## 2021-07-09 LAB — SURGICAL PATHOLOGY

## 2021-07-09 MED ORDER — NIFEDIPINE ER 30 MG PO TB24
30.0000 mg | ORAL_TABLET | Freq: Every day | ORAL | 0 refills | Status: DC
  Filled 2021-07-09: qty 90, 90d supply, fill #0

## 2021-07-09 MED ORDER — FUROSEMIDE 20 MG PO TABS
20.0000 mg | ORAL_TABLET | Freq: Every day | ORAL | 0 refills | Status: DC
  Filled 2021-07-09: qty 3, 3d supply, fill #0

## 2021-07-09 MED ORDER — OXYCODONE HCL 5 MG PO TABS
5.0000 mg | ORAL_TABLET | ORAL | 0 refills | Status: DC | PRN
  Filled 2021-07-09: qty 10, 2d supply, fill #0

## 2021-07-09 MED ORDER — IBUPROFEN 600 MG PO TABS
600.0000 mg | ORAL_TABLET | Freq: Four times a day (QID) | ORAL | 0 refills | Status: DC
  Filled 2021-07-09: qty 30, 8d supply, fill #0

## 2021-07-09 MED ORDER — ACETAMINOPHEN 325 MG PO TABS: 650.0000 mg | ORAL_TABLET | ORAL | Status: DC | PRN

## 2021-07-09 NOTE — Anesthesia Postprocedure Evaluation (Signed)
Anesthesia Post Note  Patient: International aid/development worker  Procedure(s) Performed: POST PARTUM TUBAL LIGATION     Patient location during evaluation: PACU Anesthesia Type: General Level of consciousness: awake and alert Pain management: pain level controlled Vital Signs Assessment: post-procedure vital signs reviewed and stable Respiratory status: spontaneous breathing, nonlabored ventilation and respiratory function stable Cardiovascular status: blood pressure returned to baseline and stable Postop Assessment: no apparent nausea or vomiting Anesthetic complications: no   No notable events documented.  Last Vitals:  Vitals:   07/08/21 2338 07/09/21 0505  BP: 117/80 115/81  Pulse: 61 (!) 51  Resp: 18 16  Temp: 37 C 36.8 C  SpO2: 96% 98%    Last Pain:  Vitals:   07/09/21 0940  TempSrc:   PainSc: Asleep                 Lucretia Kern

## 2021-07-09 NOTE — Social Work (Signed)
CSW received consult for hx of major depression and Edinburgh 19 answered 1 to question #10.  CSW met with MOB to offer support and complete assessment.    CSW met with MOB at bedside and introduced CSW role. CSW observed MOB siting on couch holding the infant and maternal grandmother "Roselyn" there as well. MOB presented calm and welcomed CSW visit. MOB gave CSW permission to share all information with her mom present. CSW informed MOB the reason for the visit to discuss Edinburgh, mental health hx and provide mental health support/follow up. CSW inquired how MOB has felt since giving birth. MOB reported feeling okay and shared the L&D was a very rough process. CSW inquired how MOB felt during the pregnancy. MOB shared that she was very depressed and sad during the pregnancy. MOB reported the Edinburgh "is a progression" of how she has felt the entire pregnancy. MOB shared she was diagnosed with depression in October 2022, after FOB committed suicide. MOB reported she started having SI thoughts, sought help, and was sent to a "Psych ward" which she did not feel was very helpful. MOB reported that sometimes has suicidal passive thoughts but does not have plan to act on the SI thoughts. MOB denied current thoughts of harm to self and others. MOB expressed that her children are the reason she gets up every day. CSW inquired if MOB received grief counseling following the death of her partner. MOB reported that she saw a counselor at her OB clinic for one visit which she felt was helpful. MOB reported she is not sure if she has another appointment scheduled. MOB reported she is interested in ongoing therapy. CSW provided MOB with a list for mental health providers in the area and encouraged MOB to follow up. CSW offered resources for [facility] counseling one on one for adults and group. MOB requested to follow up on her own and was appreciative of the resources provided. CSW discussed PPD symptoms. CSW provided  education regarding the baby blues period vs. perinatal mood disorders. 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 CSW highly recommended MOB complete a self-evaluation during the postpartum time period using the New Mom Checklist from Postpartum Progress and encouraged MOB to contact a medical professional if symptoms are noted at any time. MOB reported she feel comfortable reaching out to a provider if she has concerns.     MOB reported she has essential items for the infant including a bassinet where the infant will sleep. MOB reported she can use assistance with additional diapers and wipes. CSW informed MOB about [facility] services. MOB gave CSW permission to make a referral. CSW provided review of Sudden Infant Death Syndrome (SIDS) precautions. MOB has chosen [facility] for the infant's follow up care. CSW assessed MOB for additional needs. MOB reported no further need.   CSW identifies no further need for intervention and no barriers to discharge at this time.    , MSW, LCSW [facility]  Clinical Social Worker  [phone] 07/09/2021  2:44 PM   

## 2021-07-10 LAB — CYTOLOGY - NON PAP

## 2021-07-10 LAB — SURGICAL PATHOLOGY

## 2021-07-11 ENCOUNTER — Encounter: Payer: Self-pay | Admitting: Family Medicine

## 2021-07-11 ENCOUNTER — Telehealth (HOSPITAL_COMMUNITY): Payer: Self-pay | Admitting: *Deleted

## 2021-07-11 DIAGNOSIS — Z1331 Encounter for screening for depression: Secondary | ICD-10-CM

## 2021-07-11 NOTE — Telephone Encounter (Signed)
Received information from SW consult during hospital stay that patient had scored 19 on EPDS with answer 1 to #10. Referral made now to ambulatory IBH and Dr. Charlotta Newton notified via vocera.  Duffy Rhody, RN 07-11-2021 at 7:27pm

## 2021-07-12 ENCOUNTER — Telehealth: Payer: Self-pay | Admitting: *Deleted

## 2021-07-12 ENCOUNTER — Encounter: Payer: Medicaid Other | Admitting: Obstetrics

## 2021-07-12 NOTE — Telephone Encounter (Signed)
Babyscripts notification of elevated BP. TC to pt. No answer. Left VM indicating pt needed to call the office or message via MyChart. MyChart message sent as well.

## 2021-07-14 LAB — BODY FLUID CULTURE W GRAM STAIN
Culture: NO GROWTH
Gram Stain: NONE SEEN

## 2021-07-15 ENCOUNTER — Ambulatory Visit (INDEPENDENT_AMBULATORY_CARE_PROVIDER_SITE_OTHER): Payer: Medicaid Other | Admitting: Licensed Clinical Social Worker

## 2021-07-15 ENCOUNTER — Ambulatory Visit (INDEPENDENT_AMBULATORY_CARE_PROVIDER_SITE_OTHER): Payer: Medicaid Other

## 2021-07-15 VITALS — BP 135/89 | HR 77 | Wt 169.3 lb

## 2021-07-15 DIAGNOSIS — Z0130 Encounter for examination of blood pressure without abnormal findings: Secondary | ICD-10-CM

## 2021-07-15 DIAGNOSIS — F53 Postpartum depression: Secondary | ICD-10-CM | POA: Diagnosis not present

## 2021-07-15 NOTE — Progress Notes (Signed)
Patient was assessed and managed by nursing staff during this encounter. I have reviewed the chart and agree with the documentation and plan. I have also made any necessary editorial changes.  Warden Fillers, MD 07/15/2021 11:01 AM

## 2021-07-15 NOTE — Progress Notes (Signed)
..  Subjective:  Samantha Blair is a 26 y.o. female here for BP check.   Hypertension ROS: taking medications as instructed, no medication side effects noted, no TIA's, no chest pain on exertion, no dyspnea on exertion, and no swelling of ankles.    Objective:  BP 135/89   Pulse 77   LMP 10/21/2020 (Exact Date)   Appearance alert, well appearing, and in no distress. General exam BP noted to be well controlled today in office.    Assessment:   Blood Pressure well controlled.   Plan:  Current treatment plan is effective, no change in therapy.. Advised to monitor for abnormal symptoms & monitor BP at home  PP visit is scheduled for 08/14/21

## 2021-07-16 NOTE — BH Specialist Note (Signed)
Integrated Behavioral Health Follow Up In-Person Visit  MRN: [id number] Name: Samantha Blair  Number of Integrated Behavioral Health Clinician visits: 2 Session Start time:  1035am Session End time: 1115am Total time in minutes: 40 mins in person at [facility]   Types of Service: Individual psychotherapy  Interpretor:No. Interpretor Name and Language: none  Subjective: Samantha Blair is a 26 y.o. female accompanied by n/a Patient was referred by B Stalling RN for depression screen. Patient reports the following symptoms/concerns: postpartum depression, depressed mood, and  Duration of problem: approx 8 months; Severity of problem: mild  Objective: Mood: Depressed and Affect: Appropriate Risk of harm to self or others: No plan to harm self or others  Life Context: Family and Social: Live with children [employer]/Work: unemp  Self-Care: n/a Life Changes: newborn and FOB is decease   Patient and/or Family's Strengths/Protective Factors: Concrete supports in place (healthy food, safe environments, etc.)  Goals Addressed: Patient will:  Reduce symptoms of: depression   Increase knowledge and/or ability of: coping skills   Demonstrate ability to: Increase adequate support systems for patient/family  Progress towards Goals: Ongoing  Interventions: Interventions utilized:  Supportive Counseling Standardized Assessments completed: Patient declined screening and Edinburgh Postnatal Depression  Patient and/or Family Response: Samantha Blair responded well to visit   Assessment: Patient currently experiencing postpartum depression .   Patient may benefit from postpartum depression.  Plan: Follow up with behavioral health clinician on : 08/14/2021 Behavioral recommendations: Prioritize rest, communicate needs with family and supportive friends for additional support, when time permits begin journal writing, include older children in newborn care and engage in self care   Referral(s): Integrated [employer] (In Clinic) "From scale of 1-10, how likely are you to follow plan?":    Gwyndolyn Saxon, LCSW

## 2021-07-19 ENCOUNTER — Encounter: Payer: Medicaid Other | Admitting: Obstetrics and Gynecology

## 2021-07-26 ENCOUNTER — Encounter: Payer: Medicaid Other | Admitting: Obstetrics

## 2021-07-28 IMAGING — US US OB < 14 WEEKS - US OB TV
1 series · 15 of 28 positions shown · non-contrast
Comparison: None.

CLINICAL DATA: Persistent vaginal bleeding x1 month with medical
abortion performed on July 09, 2019.

EXAM:
OBSTETRIC <14 WK US AND TRANSVAGINAL OB US
TECHNIQUE: Both transabdominal and transvaginal ultrasound examinations were
performed for complete evaluation of the gestation as well as the
maternal uterus, adnexal regions, and pelvic cul-de-sac.
Transvaginal technique was performed to assess early pregnancy.

[Series 1: us ob < 14 weeks - us ob tv · 15 of 33 slices shown]
[im 1/33]
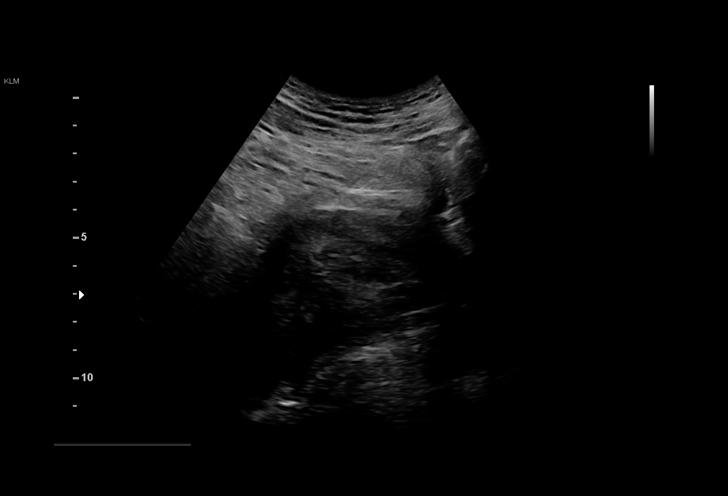
[im 3/33]
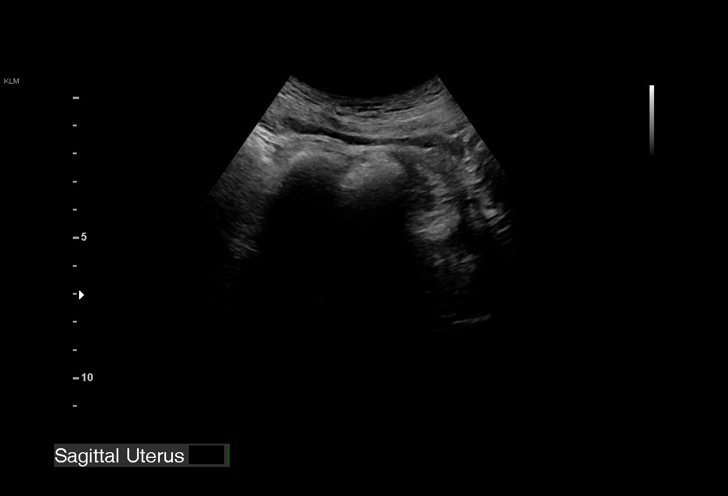
[im 5/33]
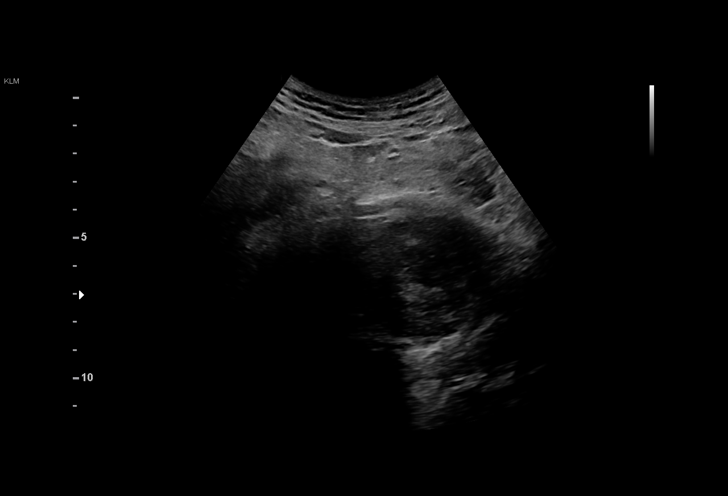
[im 8/33]
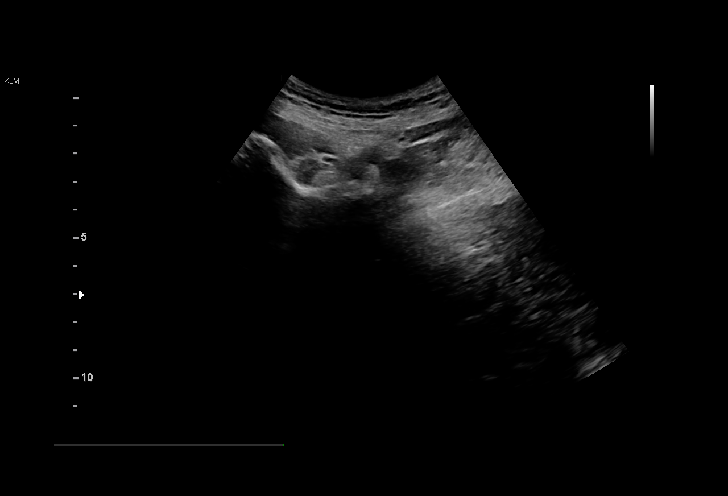
[im 10/33]
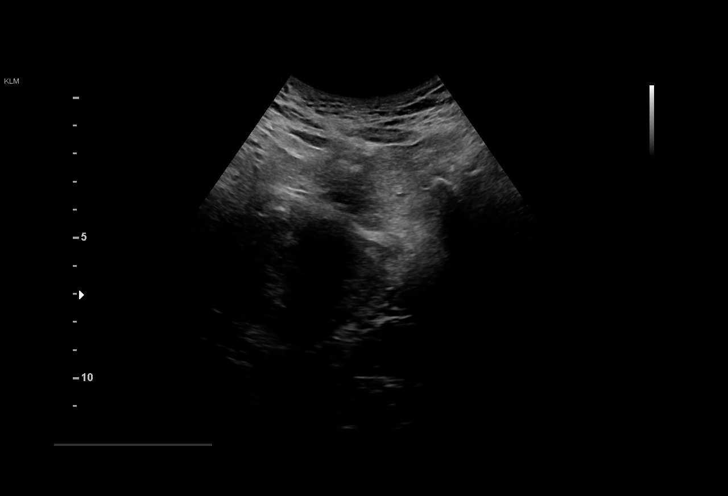
[im 12/33]
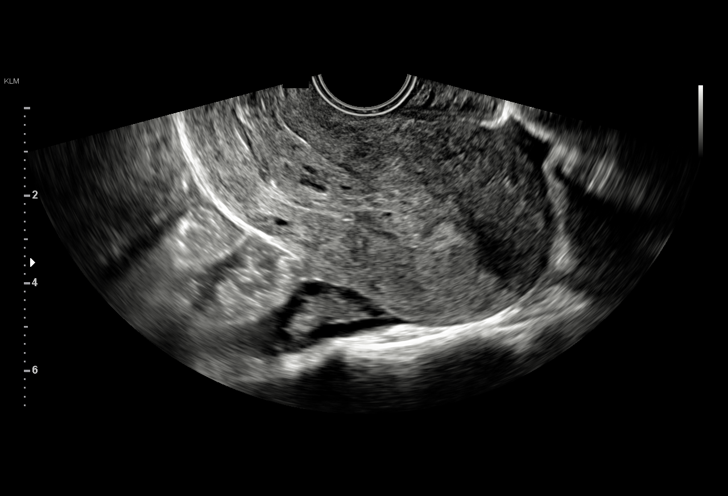
[im 15/33]
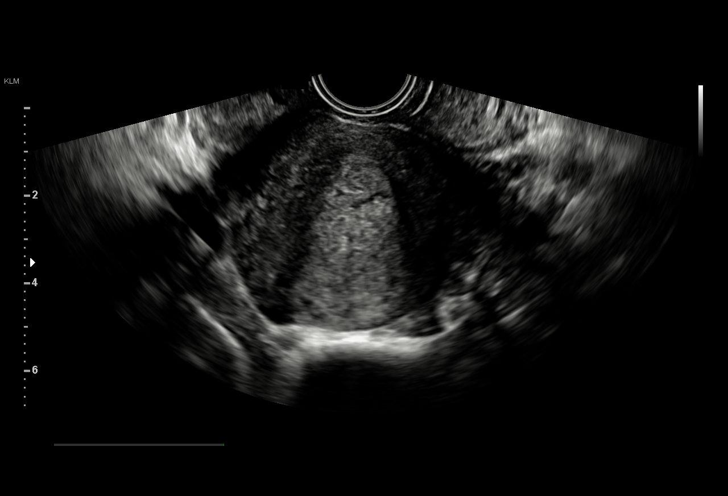
[im 17/33]
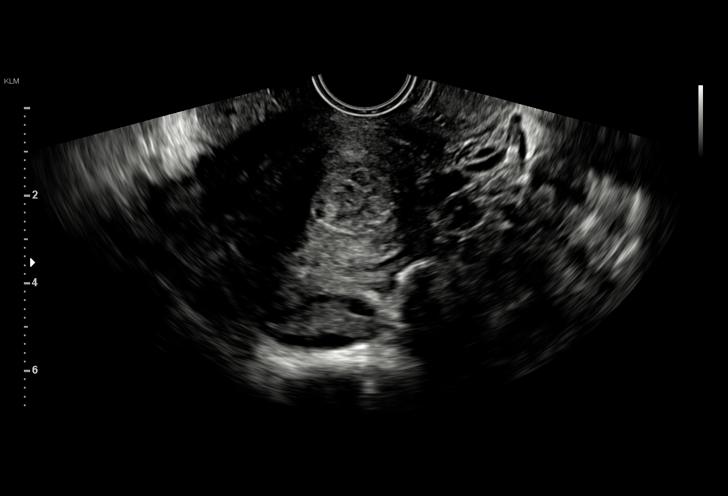
[im 18/33]
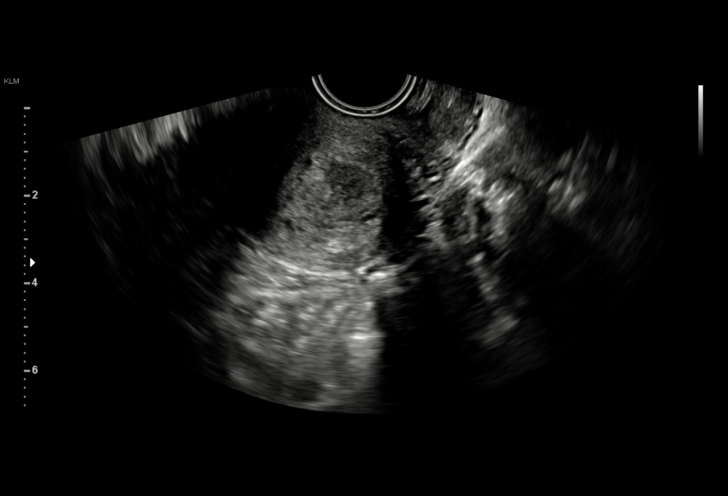
[im 21/33]
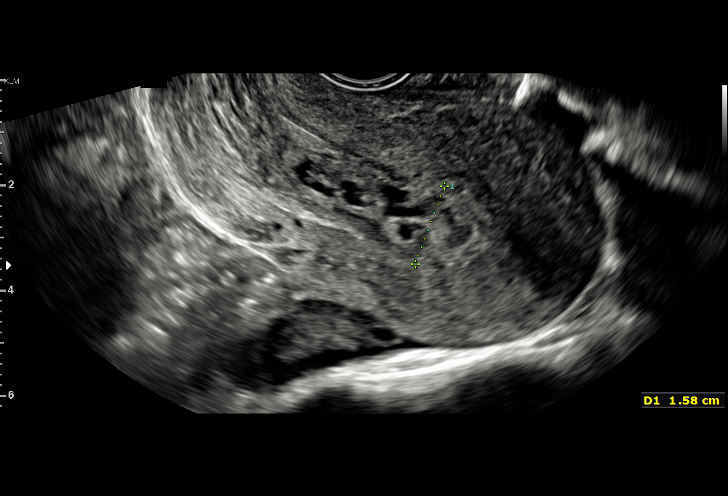
[im 23/33]
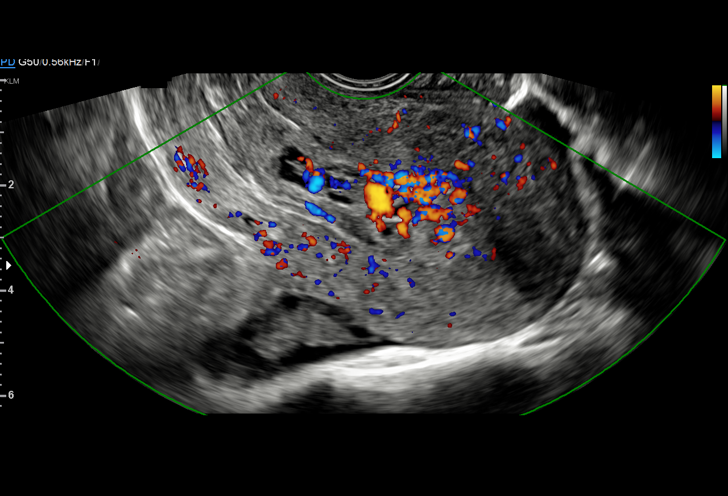
[im 25/33]
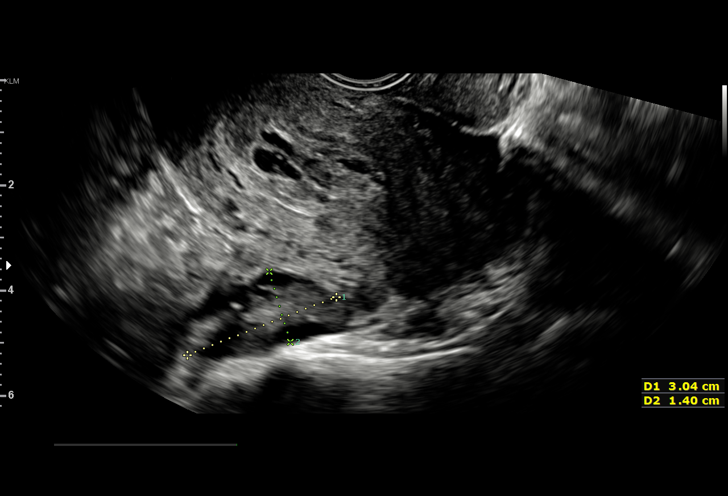
[im 28/33]
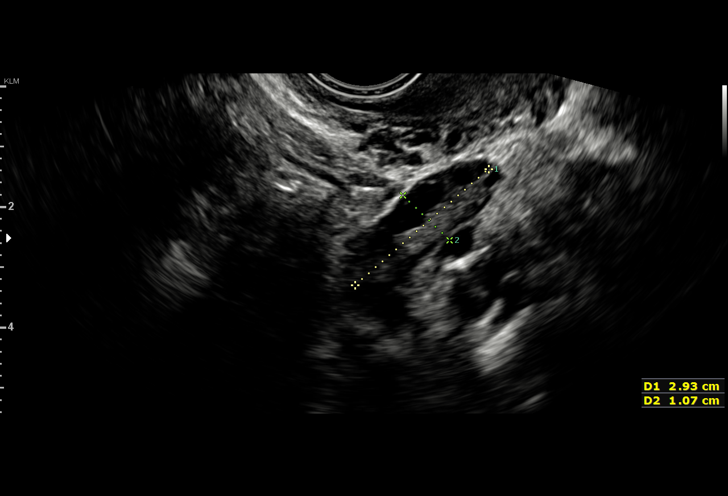
[im 30/33]
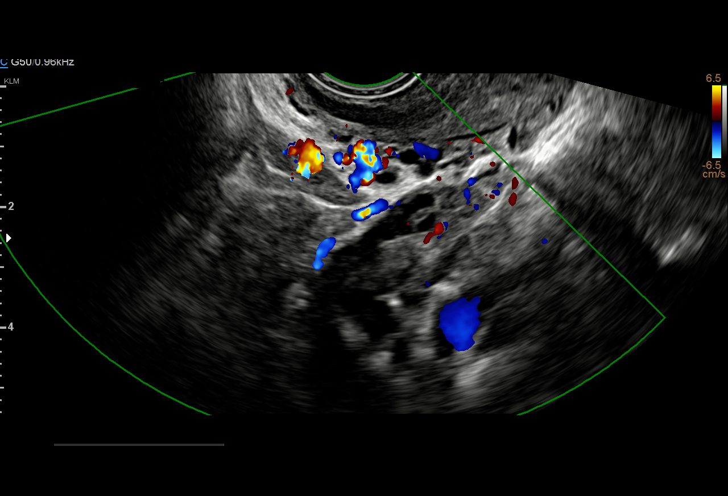
[im 33/33]
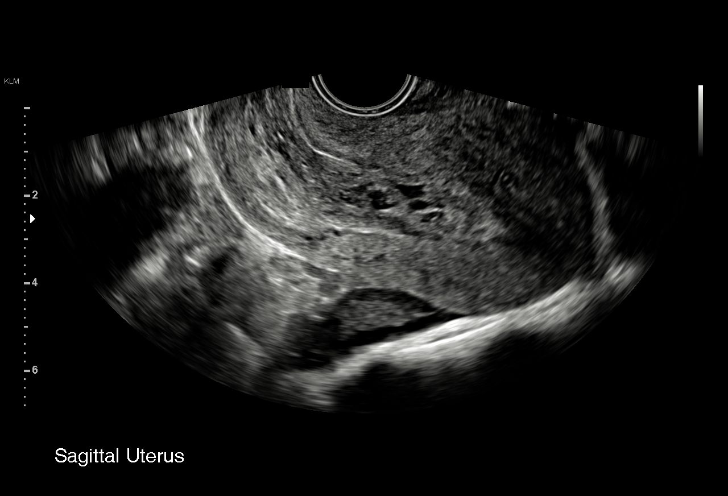

[15 of 28 positions shown; findings below may reference images not displayed]

FINDINGS: Intrauterine gestational sac: None

Yolk sac:  Not Visualized.

Embryo:  Not Visualized.

Cardiac Activity: Not Visualized.

Heart Rate: N/A  bpm

Subchorionic hemorrhage:  None visualized.

Maternal uterus/adnexae:

The endometrium is thickened (1.58 cm) and heterogeneous in
appearance. Increased flow is seen within the fundal portion of the
endometrial canal.

The left and right ovaries are visualized and are normal in
appearance.
IMPRESSION: 1. Findings consistent with retained products of conception within
the fundal portion of the endometrial canal.

## 2021-08-06 ENCOUNTER — Encounter: Payer: Self-pay | Admitting: Licensed Clinical Social Worker

## 2021-08-06 ENCOUNTER — Telehealth: Payer: Self-pay | Admitting: Licensed Clinical Social Worker

## 2021-08-06 NOTE — Telephone Encounter (Signed)
Complete phone consult with Ms. Samantha Blair. [facility] Nurse called regarding high edinburgh score LCSW A. Felton Clinton completed contact with Ms. Samantha Blair. Ms. Samantha Blair denies thoughts of self harm and/or suicide ideation. Ms. Samantha Blair reports she is adjusting well to newborn and does not have any current concerns or issues.

## 2021-08-14 ENCOUNTER — Ambulatory Visit (INDEPENDENT_AMBULATORY_CARE_PROVIDER_SITE_OTHER): Payer: Medicaid Other | Admitting: Obstetrics & Gynecology

## 2021-08-14 VITALS — BP 150/100 | HR 69 | Wt 159.0 lb

## 2021-08-14 DIAGNOSIS — O135 Gestational [pregnancy-induced] hypertension without significant proteinuria, complicating the puerperium: Secondary | ICD-10-CM

## 2021-08-14 DIAGNOSIS — O133 Gestational [pregnancy-induced] hypertension without significant proteinuria, third trimester: Secondary | ICD-10-CM

## 2021-08-14 NOTE — Progress Notes (Signed)
    Post Partum Visit Note  Samantha Blair is a 26 y.o. [phone] female who presents for a postpartum visit. She is 6 weeks postpartum following a normal spontaneous vaginal delivery.  I have fully reviewed the prenatal and intrapartum course. The delivery was at 37.1 gestational weeks.  Anesthesia: IV sedation. Postpartum course has been good. Baby is doing well. Baby is feeding by breast. Bleeding no bleeding. Bowel function is normal. Bladder function is normal. Patient is not sexually active. Contraception method is tubal ligation.   Postpartum depression screening: positive, score 18.   The pregnancy intention screening data noted above was reviewed. Potential methods of contraception were discussed. The patient elected to proceed with No data recorded.    Health Maintenance Due  Topic Date Due   COVID-19 Vaccine (1) Never done   TETANUS/TDAP  Never done    The following portions of the patient's history were reviewed and updated as appropriate: allergies, current medications, past family history, past medical history, past social history, past surgical history, and problem list.  Review of Systems Pertinent items are noted in HPI.  Objective:  LMP 10/21/2020 (Exact Date)    General:  alert, cooperative, and mild distress   Breasts:  not indicated  Lungs:   Heart:  regular rate and rhythm  Abdomen: soft, non-tender; bowel sounds normal; no masses,  no organomegaly   Wound well approximated incision  GU exam:  not indicated       Assessment:    There are no diagnoses linked to this encounter.  normal postpartum exam with HTN needs to take her medication  Plan:   Essential components of care per ACOG recommendations:  1.  Mood and well being: Patient with positive depression screening today. Reviewed local resources for support.  - Patient tobacco use? No.   - hx of drug use? No.    2. Infant care and feeding:  -Patient currently breastmilk feeding?   -Social  determinants of health (SDOH) reviewed in EPIC. No concerns  3. Sexuality, contraception and birth spacing - Patient does not want a pregnancy in the next year.  Desired family size is 4 children.  - Reviewed reproductive life planning. Reviewed contraceptive methods based on pt preferences and effectiveness.  Patient desired Female Sterilization today.   - Discussed birth spacing of 18 months  4. Sleep and fatigue -Encouraged family/partner/community support of 4 hrs of uninterrupted sleep to help with mood and fatigue  5. Physical Recovery  - Discussed patients delivery and complications. She describes her labor as good. - Patient had a Vaginal, no problems at delivery. Patient had a 1st degree laceration. Perineal healing reviewed. Patient expressed understanding - Patient has urinary incontinence? No. - Patient is safe to resume physical and sexual activity  6.  Health Maintenance - HM due items addressed Yes - Last pap smear  Diagnosis  Date Value Ref Range Status  03/05/2021   Final   - Negative for intraepithelial lesion or malignancy (NILM)   Pap smear not done at today's visit.  -Breast Cancer screening indicated? No.   7. Chronic Disease/Pregnancy Condition follow up: Hypertension  - PCP follow up  Adam Phenix, MD  Center for [facility]

## 2021-12-04 NOTE — Telephone Encounter (Signed)
This encounter was created in error - please disregard.

## 2023-08-21 NOTE — Progress Notes (Signed)
 New Patient Visit  Subjective:     Patient ID: Samantha Blair, female    DOB: 1995/05/15, 28 y.o.   MRN: [id number]  Chief Complaint  Patient presents with   Establish Care    Having a lot of mood swings, gets hot and cold, very irritable. Has been going on for the last 6 months.   Also wants to discuss acne.   Check on BP,  when pregnant BP was high and just wants to make sure its stable now.     HPI  Discussed the use of AI scribe software for clinical note transcription with the patient, who gave verbal consent to proceed.  History of Present Illness Samantha Blair is a 28 year old female who presents with thyroid -related symptoms.  Thyroid  enlargement and compressive symptoms - Enlarged thyroid  nodule present - Neck pressure changes with certain positions, such as bending down - Difficulty breathing during certain exercises - No dysphagia - No fever - No peripheral swelling  Dermatologic and hair symptoms - Skin issues present - Hair thinning with severe pruritus at the crown of the head  Mood and autonomic symptoms - Mood changes - Increased sweating  Cardiac symptoms - Occasional palpitations, attributed to anxiety - Palpitations are not daily or weekly and do not disrupt sleep  Chronic anemia and gastrointestinal symptoms - Persistent anemia, present prior to pregnancies - Treated with iron  pills and iron  infusion during most recent pregnancy - Constipation, worsened by iron  supplementation  Psychological stress - Chronic stress, with worsening over time     ROS Per HPI  Outpatient Encounter Medications as of 08/24/2023  Medication Sig   [DISCONTINUED] acetaminophen  (TYLENOL ) 325 MG tablet Take 2 tablets (650 mg total) by mouth every 4 (four) hours as needed (for pain scale < 4). (Patient not taking: Reported on 07/15/2021)   [DISCONTINUED] furosemide  (LASIX ) 20 MG tablet Take 1 tablet (20 mg total) by mouth daily. (Patient not taking: Reported on  07/15/2021)   [DISCONTINUED] ibuprofen  (ADVIL ) 600 MG tablet Take 1 tablet (600 mg total) by mouth every 6 (six) hours. (Patient not taking: Reported on 07/15/2021)   [DISCONTINUED] NIFEdipine  (ADALAT  CC) 30 MG 24 hr tablet Take 1 tablet (30 mg total) by mouth daily.   [DISCONTINUED] oxyCODONE  (OXY IR/ROXICODONE ) 5 MG immediate release tablet Take 1-2 tablets (5-10 mg total) by mouth every 4 (four) hours as needed (pain scale >7). (Patient not taking: Reported on 07/15/2021)   [DISCONTINUED] Prenatal Vit-Fe Fumarate-FA (PREPLUS) 27-1 MG TABS Take 1 tablet by mouth daily.   No facility-administered encounter medications on file as of 08/24/2023.    Past Medical History:  Diagnosis Date   Abortion    Gestational hypertension 07/07/2021   Goiter    left side of neck   History of pre-eclampsia in prior pregnancy, currently pregnant     Past Surgical History:  Procedure Laterality Date   CESAREAN SECTION     TUBAL LIGATION N/A 07/08/2021   Procedure: POST PARTUM TUBAL LIGATION;  Surgeon: Lola Donnice HERO, MD;  Location: [facility] ORS;  Service: Gynecology;  Laterality: N/A;    Family History  Problem Relation Age of Onset   Cancer Maternal Grandmother    Cancer Maternal Grandfather     Social History   Socioeconomic History   Marital status: Single    Spouse name: Not on file   Number of children: Not on file   Years of education: Not on file   Highest education level: Not on file  Occupational History   Not on file  Tobacco Use   Smoking status: Former   Smokeless tobacco: Never  Vaping Use   Vaping status: Former   Quit date: 05/31/2020  Substance and Sexual Activity   Alcohol use: Not Currently   Drug use: No   Sexual activity: Yes    Birth control/protection: None  Other Topics Concern   Not on file  Social History Narrative   Not on file   Social Drivers of Health   Financial Resource Strain: Not on file  Food Insecurity: Not on file  Transportation Needs: Not on  file  Physical Activity: Not on file  Stress: Not on file  Social Connections: Not on file  Intimate Partner Violence: Not on file       Objective:    BP 116/82   Pulse 70   Temp 97.8 F (36.6 C) (Temporal)   Ht 5' 5 (1.651 m)   Wt 181 lb (82.1 kg)   SpO2 99%   Breastfeeding No   BMI 30.12 kg/m    Physical Exam Vitals and nursing note reviewed.  Constitutional:      General: She is not in acute distress.    Appearance: Normal appearance.  HENT:     Head: Normocephalic and atraumatic.     Right Ear: External ear normal.     Left Ear: External ear normal.     Nose: Nose normal.     Mouth/Throat:     Mouth: Mucous membranes are moist.     Pharynx: Oropharynx is clear.  Eyes:     Extraocular Movements: Extraocular movements intact.     Pupils: Pupils are equal, round, and reactive to light.  Neck:     Thyroid : Thyroid  mass and thyromegaly present.      Comments: Both lobes of thyroid  are enlarged, L>R, non tender Cardiovascular:     Rate and Rhythm: Normal rate and regular rhythm.     Pulses: Normal pulses.     Heart sounds: Normal heart sounds.  Pulmonary:     Effort: Pulmonary effort is normal. No respiratory distress.     Breath sounds: Normal breath sounds. No wheezing, rhonchi or rales.  Musculoskeletal:        General: Normal range of motion.     Cervical back: Normal range of motion.     Right lower leg: No edema.     Left lower leg: No edema.  Lymphadenopathy:     Cervical: No cervical adenopathy.  Neurological:     General: No focal deficit present.     Mental Status: She is alert and oriented to person, place, and time.  Psychiatric:        Mood and Affect: Mood normal.        Thought Content: Thought content normal.     No results found for any visits on 08/24/23.      Assessment & Plan:   Assessment and Plan Assessment & Plan Thyroid  Nodule Long-standing thyroid  nodule with possible compression symptoms.  - Order ultrasound of the  neck and thyroid . - Heat Intolerance could be related - Schedule biopsy of the thyroid  nodule if indicated by ultrasound findings.  Anemia Chronic iron  deficiency anemia, previously managed with supplements and infusions. Potential contribution to fatigue and systemic symptoms. - Order complete blood count (CBC) to assess hemoglobin and hematocrit levels. - Evaluate liver and kidney function tests.  Anxiety/Mood Changes Situational anxiety exacerbated by health concerns and stressors. Discussed thyroid  impact on emotional well-being. -  Provide reassurance and support.  Lipid Screen - Lipids ordered       Orders Placed This Encounter  Procedures   US  THYROID     Standing Status:   Future    Expiration Date:   08/23/2024    Reason for Exam (SYMPTOM  OR DIAGNOSIS REQUIRED):   thyroid  nodule    Preferred imaging location?:   [facility]   US  FNA BX THYROID  1ST LESION AFIRMA    Biopsy and affirma if indicated based on US  result    Standing Status:   Future    Expiration Date:   08/23/2024    Reason for Exam (SYMPTOM  OR DIAGNOSIS REQUIRED):   thyroid  nodule    Preferred location?:   [facility]   TSH   CBC w/Diff   Comp Met (CMET)   Lipid panel    Standing Status:   Future    Number of Occurrences:   1    Expiration Date:   08/23/2024   T4, free     No orders of the defined types were placed in this encounter.   Return in about 4 weeks (around 09/21/2023) for Thyroid  f/u.  Corean LITTIE Ku, FNP

## 2023-08-21 NOTE — Patient Instructions (Signed)

## 2023-08-24 ENCOUNTER — Ambulatory Visit (INDEPENDENT_AMBULATORY_CARE_PROVIDER_SITE_OTHER): Admitting: Family Medicine

## 2023-08-24 ENCOUNTER — Encounter: Payer: Self-pay | Admitting: Family Medicine

## 2023-08-24 VITALS — BP 116/82 | HR 70 | Temp 97.8°F | Ht 65.0 in | Wt 181.0 lb

## 2023-08-24 DIAGNOSIS — Z136 Encounter for screening for cardiovascular disorders: Secondary | ICD-10-CM | POA: Insufficient documentation

## 2023-08-24 DIAGNOSIS — F332 Major depressive disorder, recurrent severe without psychotic features: Secondary | ICD-10-CM

## 2023-08-24 DIAGNOSIS — D649 Anemia, unspecified: Secondary | ICD-10-CM | POA: Insufficient documentation

## 2023-08-24 DIAGNOSIS — E041 Nontoxic single thyroid nodule: Secondary | ICD-10-CM | POA: Diagnosis not present

## 2023-08-24 DIAGNOSIS — R6889 Other general symptoms and signs: Secondary | ICD-10-CM | POA: Diagnosis not present

## 2023-08-24 DIAGNOSIS — R4586 Emotional lability: Secondary | ICD-10-CM | POA: Insufficient documentation

## 2023-08-24 DIAGNOSIS — L700 Acne vulgaris: Secondary | ICD-10-CM | POA: Insufficient documentation

## 2023-08-24 DIAGNOSIS — E559 Vitamin D deficiency, unspecified: Secondary | ICD-10-CM

## 2023-08-24 LAB — LIPID PANEL
Cholesterol: 176 mg/dL (ref 0–200)
HDL: 54.3 mg/dL (ref >39.00)
LDL Cholesterol: 109 mg/dL — ABNORMAL HIGH (ref 0–99)
NonHDL: 121.51
Total CHOL/HDL Ratio: 3
Triglycerides: 65 mg/dL (ref 0.0–149.0)
VLDL: 13 mg/dL (ref 0.0–40.0)

## 2023-08-24 LAB — CBC WITH DIFFERENTIAL/PLATELET
Basophils Absolute: 0 K/uL (ref 0.0–0.1)
Basophils Relative: 0.6 % (ref 0.0–3.0)
Eosinophils Absolute: 0.2 K/uL (ref 0.0–0.7)
Eosinophils Relative: 2.8 % (ref 0.0–5.0)
HCT: 35.3 % — ABNORMAL LOW (ref 36.0–46.0)
Hemoglobin: 11.7 g/dL — ABNORMAL LOW (ref 12.0–15.0)
Lymphocytes Relative: 27.6 % (ref 12.0–46.0)
Lymphs Abs: 2.1 K/uL (ref 0.7–4.0)
MCHC: 33.2 g/dL (ref 30.0–36.0)
MCV: 82.4 fl (ref 78.0–100.0)
Monocytes Absolute: 0.5 K/uL (ref 0.1–1.0)
Monocytes Relative: 6.9 % (ref 3.0–12.0)
Neutro Abs: 4.7 K/uL (ref 1.4–7.7)
Neutrophils Relative %: 62.1 % (ref 43.0–77.0)
Platelets: 234 K/uL (ref 150.0–400.0)
RBC: 4.29 Mil/uL (ref 3.87–5.11)
RDW: 14.3 % (ref 11.5–15.5)
WBC: 7.6 K/uL (ref 4.0–10.5)

## 2023-08-24 LAB — COMPREHENSIVE METABOLIC PANEL WITH GFR
ALT: 8 U/L (ref 0–35)
AST: 16 U/L (ref 0–37)
Albumin: 4.2 g/dL (ref 3.5–5.2)
Alkaline Phosphatase: 34 U/L — ABNORMAL LOW (ref 39–117)
BUN: 8 mg/dL (ref 6–23)
CO2: 26 meq/L (ref 19–32)
Calcium: 9 mg/dL (ref 8.4–10.5)
Chloride: 105 meq/L (ref 96–112)
Creatinine, Ser: 0.64 mg/dL (ref 0.40–1.20)
GFR: 119.2 mL/min (ref >60.00)
Glucose, Bld: 88 mg/dL (ref 70–99)
Potassium: 3.7 meq/L (ref 3.5–5.1)
Sodium: 136 meq/L (ref 135–145)
Total Bilirubin: 1.1 mg/dL (ref 0.2–1.2)
Total Protein: 7.5 g/dL (ref 6.0–8.3)

## 2023-08-24 NOTE — Assessment & Plan Note (Signed)
 CBC w/Diff today

## 2023-08-24 NOTE — Progress Notes (Signed)
 New Patient Office Visit  Subjective    Patient ID: Samantha Blair, female    DOB: [birth year]  Age: 28 y.o. MRN: [id number]  CC:  Chief Complaint  Patient presents with   Establish Care    Having a lot of mood swings, gets hot and cold, very irritable. Has been going on for the last 6 months.   Also wants to discuss acne.   Check on BP,  when pregnant BP was high and just wants to make sure its stable now.     HPI Samantha Blair presents to establish care. She reports that she has been having mood swings and is very irritable. Patient feels like she is not reacting to stress in the same fashion as she has in the past. She is also reporting heat intolerance, unexplained weight gain, and decreased energy. She is also concerned about her acne and scalp psoriasis. She states that neither complaint is new and has not noticed any recent worsening. Patient reported left anterior neck swelling for greater than 7 years. She states that she has not noticed any changes in phonation; however, she reports occasional difficulty breathing when she assumes certain positions.   Outpatient Encounter Medications as of 08/24/2023  Medication Sig   [DISCONTINUED] acetaminophen  (TYLENOL ) 325 MG tablet Take 2 tablets (650 mg total) by mouth every 4 (four) hours as needed (for pain scale < 4). (Patient not taking: Reported on 07/15/2021)   [DISCONTINUED] furosemide  (LASIX ) 20 MG tablet Take 1 tablet (20 mg total) by mouth daily. (Patient not taking: Reported on 07/15/2021)   [DISCONTINUED] ibuprofen  (ADVIL ) 600 MG tablet Take 1 tablet (600 mg total) by mouth every 6 (six) hours. (Patient not taking: Reported on 07/15/2021)   [DISCONTINUED] NIFEdipine  (ADALAT  CC) 30 MG 24 hr tablet Take 1 tablet (30 mg total) by mouth daily.   [DISCONTINUED] oxyCODONE  (OXY IR/ROXICODONE ) 5 MG immediate release tablet Take 1-2 tablets (5-10 mg total) by mouth every 4 (four) hours as needed (pain scale >7). (Patient not taking:  Reported on 07/15/2021)   [DISCONTINUED] Prenatal Vit-Fe Fumarate-FA (PREPLUS) 27-1 MG TABS Take 1 tablet by mouth daily.   No facility-administered encounter medications on file as of 08/24/2023.    Past Medical History:  Diagnosis Date   Abortion    Gestational hypertension 07/07/2021   Goiter    left side of neck   History of pre-eclampsia in prior pregnancy, currently pregnant     Past Surgical History:  Procedure Laterality Date   CESAREAN SECTION     TUBAL LIGATION N/A 07/08/2021   Procedure: POST PARTUM TUBAL LIGATION;  Surgeon: Lola Donnice HERO, MD;  Location: [facility];  Service: Gynecology;  Laterality: N/A;    Family History  Problem Relation Age of Onset   Cancer Maternal Grandmother    Cancer Maternal Grandfather     Social History   Socioeconomic History   Marital status: Single    Spouse name: Not on file   Number of children: Not on file   Years of education: Not on file   Highest education level: Not on file  Occupational History   Not on file  Tobacco Use   Smoking status: Former   Smokeless tobacco: Never  Vaping Use   Vaping status: Former   Quit date: 05/31/2020  Substance and Sexual Activity   Alcohol use: Not Currently   Drug use: No   Sexual activity: Yes    Birth control/protection: None  Other Topics Concern   Not on  file  Social History Narrative   Not on file   Social Drivers of Health   Financial Resource Strain: Not on file  Food Insecurity: Not on file  Transportation Needs: Not on file  Physical Activity: Not on file  Stress: Not on file  Social Connections: Not on file  Intimate Partner Violence: Not on file    Review of Systems  Constitutional:  Positive for diaphoresis and malaise/fatigue.  HENT:         Positive for left anterior neck swelling  Respiratory:  Positive for shortness of breath.        Positional dyspnea worse when leaning forward  Cardiovascular:  Positive for palpitations. Negative for leg swelling.   Gastrointestinal:  Negative for abdominal pain and constipation.  Genitourinary: Negative.   Musculoskeletal: Negative.   Skin:  Positive for itching.       Scalp itching  Neurological:  Negative for focal weakness and headaches.  Psychiatric/Behavioral:  The patient is nervous/anxious.       Objective    BP 116/82   Pulse 70   Temp 97.8 F (36.6 C) (Temporal)   Ht 5' 5 (1.651 m)   Wt 181 lb (82.1 kg)   SpO2 99%   Breastfeeding No   BMI 30.12 kg/m   Physical Exam Vitals and nursing note reviewed.  Constitutional:      Appearance: Normal appearance.  HENT:     Head: Normocephalic and atraumatic.     Right Ear: Tympanic membrane normal.     Left Ear: Tympanic membrane normal.     Nose: Nose normal.     Mouth/Throat:     Mouth: Mucous membranes are moist.     Pharynx: Oropharynx is clear.  Eyes:     Extraocular Movements: Extraocular movements intact.     Conjunctiva/sclera: Conjunctivae normal.     Pupils: Pupils are equal, round, and reactive to light.  Neck:     Thyroid : Thyroid  mass and thyromegaly present.  Cardiovascular:     Rate and Rhythm: Normal rate and regular rhythm.     Pulses: Normal pulses.     Heart sounds: Normal heart sounds.  Pulmonary:     Effort: Pulmonary effort is normal.     Breath sounds: Normal breath sounds.  Abdominal:     General: Bowel sounds are normal.     Palpations: Abdomen is soft.  Musculoskeletal:        General: Normal range of motion.     Cervical back: Normal range of motion.  Skin:    General: Skin is warm and dry.     Capillary Refill: Capillary refill takes less than 2 seconds.  Neurological:     Mental Status: She is alert and oriented to person, place, and time.  Psychiatric:        Mood and Affect: Mood normal.        Behavior: Behavior normal.    Assessment & Plan:   Problem List Items Addressed This Visit       Endocrine   Thyroid  nodule - Primary   Relevant Orders   TSH   US  THYROID    US  FNA BX  THYROID  1ST LESION AFIRMA   CBC w/Diff   Comp Met (CMET)   T4, free     Other   Heat intolerance   Relevant Orders   TSH   CBC w/Diff   Comp Met (CMET)   Encounter for screening for cardiovascular disorders   Relevant Orders   CBC w/Diff  Comp Met (CMET)   Lipid panel   Mood changes   Anemia   CBC w/Diff today       Return in about 4 weeks (around 09/21/2023) for Thyroid  f/u.   Debby CHRISTELLA Borer, RN

## 2023-08-25 LAB — TSH: TSH: 1.09 u[IU]/mL (ref 0.35–5.50)

## 2023-08-25 LAB — T4, FREE: Free T4: 0.78 ng/dL (ref 0.60–1.60)

## 2023-08-27 ENCOUNTER — Ambulatory Visit: Payer: Self-pay | Admitting: Family Medicine

## 2023-09-17 ENCOUNTER — Ambulatory Visit: Admitting: Family Medicine

## 2023-09-17 NOTE — Progress Notes (Deleted)
   Established Patient Office Visit  Subjective:     Patient ID: Samantha Blair, female    DOB: 08-16-1995, 28 y.o.   MRN: [id number]  No chief complaint on file.   HPI  Discussed the use of AI scribe software for clinical note transcription with the patient, who gave verbal consent to proceed.  History of Present Illness      ROS Per HPI      Objective:    There were no vitals taken for this visit.   Physical Exam Vitals and nursing note reviewed.  Constitutional:      General: She is not in acute distress.    Appearance: Normal appearance. She is normal weight.  HENT:     Head: Normocephalic and atraumatic.     Right Ear: External ear normal.     Left Ear: External ear normal.     Nose: Nose normal.     Mouth/Throat:     Mouth: Mucous membranes are moist.     Pharynx: Oropharynx is clear.  Eyes:     Extraocular Movements: Extraocular movements intact.     Pupils: Pupils are equal, round, and reactive to light.  Cardiovascular:     Rate and Rhythm: Normal rate and regular rhythm.     Pulses: Normal pulses.     Heart sounds: Normal heart sounds.  Pulmonary:     Effort: Pulmonary effort is normal. No respiratory distress.     Breath sounds: Normal breath sounds. No wheezing, rhonchi or rales.  Musculoskeletal:        General: Normal range of motion.     Cervical back: Normal range of motion.     Right lower leg: No edema.     Left lower leg: No edema.  Lymphadenopathy:     Cervical: No cervical adenopathy.  Neurological:     General: No focal deficit present.     Mental Status: She is alert and oriented to person, place, and time.  Psychiatric:        Mood and Affect: Mood normal.        Thought Content: Thought content normal.     No results found for any visits on 09/17/23.  The ASCVD Risk score (Arnett DK, et al., 2019) failed to calculate for the following reasons:   The 2019 ASCVD risk score is only valid for ages 80 to 62  {Vitals History  (Optional):23777}  {Show previous labs (optional):23779}     Assessment & Plan:   Assessment and Plan Assessment & Plan      No orders of the defined types were placed in this encounter.    No orders of the defined types were placed in this encounter.   No follow-ups on file.  Corean LITTIE Ku, FNP

## 2024-01-29 ENCOUNTER — Encounter: Payer: Self-pay | Admitting: Family Medicine

## 2024-01-29 ENCOUNTER — Ambulatory Visit: Admitting: Family Medicine

## 2024-01-29 VITALS — BP 122/92 | HR 81 | Temp 98.4°F | Ht 65.0 in | Wt 177.4 lb

## 2024-01-29 DIAGNOSIS — E041 Nontoxic single thyroid nodule: Secondary | ICD-10-CM

## 2024-01-29 DIAGNOSIS — D508 Other iron deficiency anemias: Secondary | ICD-10-CM

## 2024-01-29 DIAGNOSIS — R221 Localized swelling, mass and lump, neck: Secondary | ICD-10-CM

## 2024-01-29 DIAGNOSIS — R42 Dizziness and giddiness: Secondary | ICD-10-CM | POA: Diagnosis not present

## 2024-01-29 DIAGNOSIS — D509 Iron deficiency anemia, unspecified: Secondary | ICD-10-CM | POA: Diagnosis not present

## 2024-01-29 LAB — COMPREHENSIVE METABOLIC PANEL WITH GFR
ALT: 8 U/L (ref 3–35)
AST: 16 U/L (ref 5–37)
Albumin: 4.4 g/dL (ref 3.5–5.2)
Alkaline Phosphatase: 43 U/L (ref 39–117)
BUN: 13 mg/dL (ref 6–23)
CO2: 29 meq/L (ref 19–32)
Calcium: 9.5 mg/dL (ref 8.4–10.5)
Chloride: 101 meq/L (ref 96–112)
Creatinine, Ser: 0.68 mg/dL (ref 0.40–1.20)
GFR: 117.12 mL/min
Glucose, Bld: 82 mg/dL (ref 70–99)
Potassium: 3.9 meq/L (ref 3.5–5.1)
Sodium: 136 meq/L (ref 135–145)
Total Bilirubin: 0.9 mg/dL (ref 0.2–1.2)
Total Protein: 8.3 g/dL (ref 6.0–8.3)

## 2024-01-29 LAB — CBC WITH DIFFERENTIAL/PLATELET
Basophils Absolute: 0 K/uL (ref 0.0–0.1)
Basophils Relative: 0.5 % (ref 0.0–3.0)
Eosinophils Absolute: 0.2 K/uL (ref 0.0–0.7)
Eosinophils Relative: 2.8 % (ref 0.0–5.0)
HCT: 37.7 % (ref 36.0–46.0)
Hemoglobin: 12.7 g/dL (ref 12.0–15.0)
Lymphocytes Relative: 23.9 % (ref 12.0–46.0)
Lymphs Abs: 1.6 K/uL (ref 0.7–4.0)
MCHC: 33.7 g/dL (ref 30.0–36.0)
MCV: 83.5 fl (ref 78.0–100.0)
Monocytes Absolute: 0.6 K/uL (ref 0.1–1.0)
Monocytes Relative: 8.3 % (ref 3.0–12.0)
Neutro Abs: 4.3 K/uL (ref 1.4–7.7)
Neutrophils Relative %: 64.5 % (ref 43.0–77.0)
Platelets: 237 K/uL (ref 150.0–400.0)
RBC: 4.52 Mil/uL (ref 3.87–5.11)
RDW: 13.9 % (ref 11.5–15.5)
WBC: 6.7 K/uL (ref 4.0–10.5)

## 2024-01-29 LAB — TSH: TSH: 0.86 u[IU]/mL (ref 0.35–5.50)

## 2024-01-29 LAB — VITAMIN B12: Vitamin B-12: 851 pg/mL (ref 211–911)

## 2024-01-29 LAB — VITAMIN D 25 HYDROXY (VIT D DEFICIENCY, FRACTURES): VITD: 12.21 ng/mL — ABNORMAL LOW (ref 30.00–100.00)

## 2024-01-29 NOTE — Progress Notes (Signed)
 "  Established Patient Office Visit  Subjective:     Patient ID: Samantha Blair, female    DOB: [birth year], 29 y.o.   MRN: [id number]  No chief complaint on file.   HPI  Discussed the use of AI scribe software for clinical note transcription with the patient, who gave verbal consent to proceed.  History of Present Illness Samantha Blair is a 29 year old female who presents with concerns about anemia and thyroid  swelling.  Menstrual bleeding and anemia concerns - Concerned about anemia and requests iron  panel evaluation - Menstrual periods are not heavy but include clots - Suspects possible increased blood loss despite absence of heavy flow  Neck swelling and thyroid  symptoms - Thyroid  laboratory studies are within normal limits - Notices neck swelling with intermittent difficulty swallowing and breathing, especially when lying down - Uncertain if symptoms are due to thyroid  nodule, lymph node enlargement, or fluid accumulation - Seeking further evaluation for etiology of neck swelling and associated symptoms     ROS Per HPI      Objective:    BP (!) 122/92 (BP Location: Left Arm, Patient Position: Sitting)   Pulse 81   Temp 98.4 F (36.9 C) (Temporal)   Ht 5' 5 (1.651 m)   Wt 177 lb 6.4 oz (80.5 kg)   LMP 01/26/2024 (Exact Date)   SpO2 99%   BMI 29.52 kg/m    Physical Exam Vitals and nursing note reviewed.  Constitutional:      General: She is not in acute distress.    Appearance: Normal appearance. She is normal weight.  HENT:     Head: Normocephalic and atraumatic.     Right Ear: External ear normal.     Left Ear: External ear normal.     Nose: Nose normal.     Mouth/Throat:     Mouth: Mucous membranes are moist.     Pharynx: Oropharynx is clear.  Eyes:     Extraocular Movements: Extraocular movements intact.     Pupils: Pupils are equal, round, and reactive to light.  Neck:     Thyroid : Thyromegaly present.  Cardiovascular:     Rate and Rhythm:  Normal rate and regular rhythm.     Pulses: Normal pulses.     Heart sounds: Normal heart sounds.  Pulmonary:     Effort: Pulmonary effort is normal. No respiratory distress.     Breath sounds: Normal breath sounds. No wheezing, rhonchi or rales.  Musculoskeletal:        General: Normal range of motion.     Cervical back: Normal range of motion.     Right lower leg: No edema.     Left lower leg: No edema.  Lymphadenopathy:     Cervical: No cervical adenopathy.  Neurological:     General: No focal deficit present.     Mental Status: She is alert and oriented to person, place, and time.  Psychiatric:        Mood and Affect: Mood normal.        Thought Content: Thought content normal.     Results for orders placed or performed in visit on 01/29/24  CBC with Differential/Platelet  Result Value Ref Range   WBC 6.7 4.0 - 10.5 K/uL   RBC 4.52 3.87 - 5.11 Mil/uL   Hemoglobin 12.7 12.0 - 15.0 g/dL   HCT 62.2 63.9 - 53.9 %   MCV 83.5 78.0 - 100.0 fl   MCHC 33.7 30.0 - 36.0 g/dL  RDW 13.9 11.5 - 15.5 %   Platelets 237.0 150.0 - 400.0 K/uL   Neutrophils Relative % 64.5 43.0 - 77.0 %   Lymphocytes Relative 23.9 12.0 - 46.0 %   Monocytes Relative 8.3 3.0 - 12.0 %   Eosinophils Relative 2.8 0.0 - 5.0 %   Basophils Relative 0.5 0.0 - 3.0 %   Neutro Abs 4.3 1.4 - 7.7 K/uL   Lymphs Abs 1.6 0.7 - 4.0 K/uL   Monocytes Absolute 0.6 0.1 - 1.0 K/uL   Eosinophils Absolute 0.2 0.0 - 0.7 K/uL   Basophils Absolute 0.0 0.0 - 0.1 K/uL  TSH  Result Value Ref Range   TSH 0.86 0.35 - 5.50 uIU/mL  Vitamin B12  Result Value Ref Range   Vitamin B-12 851 211 - 911 pg/mL  VITAMIN D  25 Hydroxy (Vit-D Deficiency, Fractures)  Result Value Ref Range   VITD 12.21 (L) 30.00 - 100.00 ng/mL  Comprehensive metabolic panel with GFR  Result Value Ref Range   Sodium 136 135 - 145 mEq/L   Potassium 3.9 3.5 - 5.1 mEq/L   Chloride 101 96 - 112 mEq/L   CO2 29 19 - 32 mEq/L   Glucose, Bld 82 70 - 99 mg/dL    BUN 13 6 - 23 mg/dL   Creatinine, Ser 9.31 0.40 - 1.20 mg/dL   Total Bilirubin 0.9 0.2 - 1.2 mg/dL   Alkaline Phosphatase 43 39 - 117 U/L   AST 16 5 - 37 U/L   ALT 8 3 - 35 U/L   Total Protein 8.3 6.0 - 8.3 g/dL   Albumin 4.4 3.5 - 5.2 g/dL   GFR 882.87 >39.99 mL/min   Calcium 9.5 8.4 - 10.5 mg/dL  Iron , TIBC and Ferritin Panel  Result Value Ref Range   Iron  72 40 - 190 mcg/dL   TIBC 632 749 - 549 mcg/dL (calc)   %SAT 20 16 - 45 % (calc)   Ferritin 22 16 - 154 ng/mL    BP Readings from Last 3 Encounters:  01/29/24 (!) 122/92  08/24/23 116/82  08/14/21 (!) 150/100   Wt Readings from Last 3 Encounters:  01/29/24 177 lb 6.4 oz (80.5 kg)  08/24/23 181 lb (82.1 kg)  08/14/21 159 lb (72.1 kg)      Last CBC Lab Results  Component Value Date   WBC 6.7 01/29/2024   HGB 12.7 01/29/2024   HCT 37.7 01/29/2024   MCV 83.5 01/29/2024   MCH 20.7 (L) 07/08/2021   RDW 13.9 01/29/2024   PLT 237.0 01/29/2024   Last metabolic panel Lab Results  Component Value Date   GLUCOSE 82 01/29/2024   NA 136 01/29/2024   K 3.9 01/29/2024   CL 101 01/29/2024   CO2 29 01/29/2024   BUN 13 01/29/2024   CREATININE 0.68 01/29/2024   GFR 117.12 01/29/2024   CALCIUM 9.5 01/29/2024   PROT 8.3 01/29/2024   ALBUMIN 4.4 01/29/2024   BILITOT 0.9 01/29/2024   ALKPHOS 43 01/29/2024   AST 16 01/29/2024   ALT 8 01/29/2024   ANIONGAP 7 07/07/2021         Assessment & Plan:   Assessment and Plan Assessment & Plan Thyroid  nodule with neck mass Persistent neck mass with differential diagnosis including thyroid  nodule, lymph node, or fluid accumulation. Previous thyroid  labs normal. Symptoms include occasional dysphagia and dyspnea. Considered hormonal imbalance as a contributing factor. - Attempted to obtain insurance approval for ultrasound of neck mass. - If ultrasound not approved, will order  CT scan of neck. - Referred to lab for further evaluation.  Chronic iron  deficiency anemia,  dizziness Chronic iron  deficiency anemia with previous abnormal labs. No heavy menstrual bleeding, but periods lumpy with occasional clots. - Ordered iron  panel. - Checked vitamin levels. - Referred to lab for blood tests.     Orders Placed This Encounter  Procedures   US  THYROID     [employer] [id number] EPIC ORDER    Standing Status:   Future    Expiration Date:   01/28/2025    Reason for Exam (SYMPTOM  OR DIAGNOSIS REQUIRED):   thyroid  nodule    Preferred imaging location?:   [facility]   CBC with Differential/Platelet    Release to patient:   Immediate [1]   TSH   Vitamin B12   VITAMIN D  25 Hydroxy (Vit-D Deficiency, Fractures)   Comprehensive metabolic panel with GFR    Release to patient:   Immediate [1]   Iron , TIBC and Ferritin Panel     No orders of the defined types were placed in this encounter.   Return if symptoms worsen or fail to improve.  Corean LITTIE Ku, FNP   "

## 2024-01-30 LAB — IRON,TIBC AND FERRITIN PANEL
%SAT: 20 % (ref 16–45)
Ferritin: 22 ng/mL (ref 16–154)
Iron: 72 ug/dL (ref 40–190)
TIBC: 367 ug/dL (ref 250–450)

## 2024-02-03 ENCOUNTER — Ambulatory Visit: Payer: Self-pay | Admitting: Family Medicine

## 2024-02-06 NOTE — Patient Instructions (Signed)
 We are checking labs today, will be in contact with any results that require further attention  Since we have crossed over into the new year, I am going to reorder the ultrasound for your thyroid .  If this is not approved please let me know and I will try to get a CT so we can further evaluate with going on with your neck.  Follow-up with me for new or worsening symptoms.
